# Patient Record
Sex: Female | Born: 1965 | Race: Black or African American | Hispanic: No | Marital: Married | Smoking: Current every day smoker
Health system: Southern US, Community
[De-identification: ages and names within clinical notes are randomized; demographics above are authoritative.]

## PROBLEM LIST (undated history)

## (undated) HISTORY — PX: TUBAL LIGATION: SHX77

---

## 1998-04-04 ENCOUNTER — Other Ambulatory Visit: Admission: RE | Admit: 1998-04-04 | Discharge: 1998-04-04 | Payer: Self-pay | Admitting: Obstetrics

## 1998-04-29 ENCOUNTER — Other Ambulatory Visit: Admission: RE | Admit: 1998-04-29 | Discharge: 1998-04-29 | Payer: Self-pay | Admitting: Obstetrics

## 1998-05-17 ENCOUNTER — Ambulatory Visit (HOSPITAL_COMMUNITY): Admission: RE | Admit: 1998-05-17 | Discharge: 1998-05-17 | Payer: Self-pay | Admitting: Obstetrics

## 2011-04-07 ENCOUNTER — Emergency Department (HOSPITAL_COMMUNITY)
Admission: EM | Admit: 2011-04-07 | Discharge: 2011-04-07 | Disposition: A | Discharge status: Home / Self Care | Payer: Managed Care, Other (non HMO) | Attending: Emergency Medicine | Admitting: Emergency Medicine

## 2011-04-07 DIAGNOSIS — M545 Low back pain, unspecified: Secondary | ICD-10-CM | POA: Insufficient documentation

## 2011-04-07 DIAGNOSIS — M549 Dorsalgia, unspecified: Secondary | ICD-10-CM | POA: Insufficient documentation

## 2011-04-09 ENCOUNTER — Emergency Department (HOSPITAL_COMMUNITY): Payer: Managed Care, Other (non HMO)

## 2011-04-09 ENCOUNTER — Emergency Department (HOSPITAL_COMMUNITY)
Admission: EM | Admit: 2011-04-09 | Discharge: 2011-04-09 | Disposition: A | Discharge status: Home / Self Care | Payer: Managed Care, Other (non HMO) | Attending: Emergency Medicine | Admitting: Emergency Medicine

## 2011-04-09 DIAGNOSIS — M5430 Sciatica, unspecified side: Secondary | ICD-10-CM | POA: Insufficient documentation

## 2011-04-09 DIAGNOSIS — M545 Low back pain, unspecified: Secondary | ICD-10-CM | POA: Insufficient documentation

## 2011-04-17 ENCOUNTER — Emergency Department (HOSPITAL_COMMUNITY): Admission: EM | Admit: 2011-04-17 | Payer: Managed Care, Other (non HMO) | Source: Home / Self Care

## 2013-01-11 ENCOUNTER — Encounter (HOSPITAL_COMMUNITY): Payer: Self-pay | Admitting: Emergency Medicine

## 2013-01-11 ENCOUNTER — Emergency Department (HOSPITAL_COMMUNITY): Payer: Managed Care, Other (non HMO)

## 2013-01-11 ENCOUNTER — Emergency Department (HOSPITAL_COMMUNITY)
Admission: EM | Admit: 2013-01-11 | Discharge: 2013-01-11 | Disposition: A | Discharge status: Home / Self Care | Payer: Managed Care, Other (non HMO) | Attending: Emergency Medicine | Admitting: Emergency Medicine

## 2013-01-11 DIAGNOSIS — S335XXA Sprain of ligaments of lumbar spine, initial encounter: Secondary | ICD-10-CM | POA: Insufficient documentation

## 2013-01-11 DIAGNOSIS — Y9389 Activity, other specified: Secondary | ICD-10-CM | POA: Insufficient documentation

## 2013-01-11 DIAGNOSIS — S139XXA Sprain of joints and ligaments of unspecified parts of neck, initial encounter: Secondary | ICD-10-CM | POA: Insufficient documentation

## 2013-01-11 DIAGNOSIS — S161XXA Strain of muscle, fascia and tendon at neck level, initial encounter: Secondary | ICD-10-CM

## 2013-01-11 DIAGNOSIS — F17200 Nicotine dependence, unspecified, uncomplicated: Secondary | ICD-10-CM | POA: Insufficient documentation

## 2013-01-11 DIAGNOSIS — Y92410 Unspecified street and highway as the place of occurrence of the external cause: Secondary | ICD-10-CM | POA: Insufficient documentation

## 2013-01-11 MED ORDER — NAPROXEN 500 MG PO TABS: 500.0000 mg | ORAL_TABLET | Freq: Two times a day (BID) | ORAL | Status: DC

## 2013-01-11 MED ORDER — METHOCARBAMOL 500 MG PO TABS: 500.0000 mg | ORAL_TABLET | Freq: Two times a day (BID) | ORAL | Status: DC

## 2013-01-11 NOTE — ED Provider Notes (Signed)
Medical screening examination/treatment/procedure(s) were performed by non-physician practitioner and as supervising physician I was immediately available for consultation/collaboration.  [name], MD 01/11/13 2304 
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
## 2015-12-27 ENCOUNTER — Other Ambulatory Visit (HOSPITAL_COMMUNITY)
Admission: RE | Admit: 2015-12-27 | Discharge: 2015-12-27 | Disposition: A | Discharge status: Home / Self Care | Payer: BLUE CROSS/BLUE SHIELD | Source: Ambulatory Visit | Attending: Family Medicine | Admitting: Family Medicine

## 2015-12-27 ENCOUNTER — Other Ambulatory Visit: Payer: Self-pay | Admitting: Family Medicine

## 2015-12-27 DIAGNOSIS — Z124 Encounter for screening for malignant neoplasm of cervix: Secondary | ICD-10-CM | POA: Insufficient documentation

## 2015-12-29 LAB — CYTOLOGY - PAP

## 2019-04-21 ENCOUNTER — Other Ambulatory Visit: Payer: Self-pay

## 2019-04-21 ENCOUNTER — Encounter (HOSPITAL_COMMUNITY): Payer: Self-pay | Admitting: Family Medicine

## 2019-04-21 ENCOUNTER — Emergency Department (HOSPITAL_COMMUNITY)
Admission: EM | Admit: 2019-04-21 | Discharge: 2019-04-21 | Disposition: A | Discharge status: Home / Self Care | Payer: Managed Care, Other (non HMO) | Attending: Emergency Medicine | Admitting: Emergency Medicine

## 2019-04-21 ENCOUNTER — Emergency Department (HOSPITAL_COMMUNITY): Payer: Managed Care, Other (non HMO)

## 2019-04-21 DIAGNOSIS — R030 Elevated blood-pressure reading, without diagnosis of hypertension: Secondary | ICD-10-CM | POA: Insufficient documentation

## 2019-04-21 DIAGNOSIS — R51 Headache: Secondary | ICD-10-CM | POA: Diagnosis present

## 2019-04-21 DIAGNOSIS — F17210 Nicotine dependence, cigarettes, uncomplicated: Secondary | ICD-10-CM | POA: Insufficient documentation

## 2019-04-21 LAB — BASIC METABOLIC PANEL
Anion gap: 7 (ref 5–15)
BUN: 8 mg/dL (ref 6–20)
CO2: 27 mmol/L (ref 22–32)
Calcium: 9 mg/dL (ref 8.9–10.3)
Chloride: 107 mmol/L (ref 98–111)
Creatinine, Ser: 0.56 mg/dL (ref 0.44–1.00)
GFR calc Af Amer: >60 mL/min (ref >60)
GFR calc non Af Amer: >60 mL/min (ref >60)
Glucose, Bld: 84 mg/dL (ref 70–99)
Potassium: 4 mmol/L (ref 3.5–5.1)
Sodium: 141 mmol/L (ref 135–145)

## 2019-04-21 LAB — I-STAT BETA HCG BLOOD, ED (MC, WL, AP ONLY): I-stat hCG, quantitative: <5 m[IU]/mL (ref <5)

## 2019-04-21 LAB — CBC WITH DIFFERENTIAL/PLATELET
Abs Immature Granulocytes: 0 10*3/uL (ref 0.00–0.07)
Basophils Absolute: 0.1 10*3/uL (ref 0.0–0.1)
Basophils Relative: 2 %
Eosinophils Absolute: 0.2 10*3/uL (ref 0.0–0.5)
Eosinophils Relative: 4 %
HCT: 46 % (ref 36.0–46.0)
Hemoglobin: 14.6 g/dL (ref 12.0–15.0)
Immature Granulocytes: 0 %
Lymphocytes Relative: 40 %
Lymphs Abs: 1.8 10*3/uL (ref 0.7–4.0)
MCH: 31.1 pg (ref 26.0–34.0)
MCHC: 31.7 g/dL (ref 30.0–36.0)
MCV: 97.9 fL (ref 80.0–100.0)
Monocytes Absolute: 0.3 10*3/uL (ref 0.1–1.0)
Monocytes Relative: 6 %
Neutro Abs: 2.2 10*3/uL (ref 1.7–7.7)
Neutrophils Relative %: 48 %
Platelets: 244 10*3/uL (ref 150–400)
RBC: 4.7 MIL/uL (ref 3.87–5.11)
RDW: 13.6 % (ref 11.5–15.5)
WBC: 4.5 10*3/uL (ref 4.0–10.5)
nRBC: 0 % (ref 0.0–0.2)

## 2019-04-21 LAB — URINALYSIS, ROUTINE W REFLEX MICROSCOPIC
Bacteria, UA: NONE SEEN
Bilirubin Urine: NEGATIVE
Glucose, UA: NEGATIVE mg/dL
Ketones, ur: NEGATIVE mg/dL
Nitrite: NEGATIVE
Protein, ur: NEGATIVE mg/dL
Specific Gravity, Urine: 1.001 — ABNORMAL LOW (ref 1.005–1.030)
pH: 7 (ref 5.0–8.0)

## 2019-04-21 LAB — I-STAT CHEM 8, ED
BUN: 7 mg/dL (ref 6–20)
Calcium, Ion: 1.14 mmol/L — ABNORMAL LOW (ref 1.15–1.40)
Chloride: 105 mmol/L (ref 98–111)
Creatinine, Ser: 0.6 mg/dL (ref 0.44–1.00)
Glucose, Bld: 81 mg/dL (ref 70–99)
HCT: 45 % (ref 36.0–46.0)
Hemoglobin: 15.3 g/dL — ABNORMAL HIGH (ref 12.0–15.0)
Potassium: 4.1 mmol/L (ref 3.5–5.1)
Sodium: 139 mmol/L (ref 135–145)
TCO2: 27 mmol/L (ref 22–32)

## 2019-04-21 LAB — TROPONIN I: Troponin I: <0.03 ng/mL (ref <0.03)

## 2019-04-21 MED ORDER — ACETAMINOPHEN 325 MG PO TABS
650.0000 mg | ORAL_TABLET | Freq: Once | ORAL | Status: AC
  Administered 2019-04-21: 11:00:00 650 mg via ORAL
  Filled 2019-04-21: qty 2

## 2019-04-21 MED ORDER — LISINOPRIL 10 MG PO TABS: 10.0000 mg | ORAL_TABLET | Freq: Every day | ORAL | 1 refills | Status: DC

## 2019-04-21 MED ORDER — HYDRALAZINE HCL 20 MG/ML IJ SOLN
20.0000 mg | Freq: Once | INTRAMUSCULAR | Status: AC
  Administered 2019-04-21: 20 mg via INTRAVENOUS
  Filled 2019-04-21: qty 1

## 2019-04-21 NOTE — ED Notes (Signed)
Pt d/c home per MD order, Discharge summary reviewed, pt verbalizes understanding. Ambulatory, no s/s of distress noted.

## 2019-04-21 NOTE — Discharge Instructions (Addendum)
Regarding your blood pressure, please follow-up with a primary care provider. There were some abnormalities noted on the EKG today.  These may be related to your high blood pressure.  Recommend follow-up with the cardiologist on this matter. Return to the emergency department for dizziness, vision changes, change in feeling or function in the extremities, chest pain, shortness of breath, or any other major concerns.

## 2019-04-21 NOTE — ED Notes (Signed)
Bed: WA19 Expected date:  Expected time:  Means of arrival:  Comments: 

## 2019-04-21 NOTE — ED Triage Notes (Signed)
Patient is complaining of right occipital headache for the last three days. Denies dizziness and lightheadedness. Patient had her BP checked at work, went to an urgent care, and referred to ED for further evaluation.

## 2019-04-21 NOTE — ED Provider Notes (Addendum)
[facility]-EMERGENCY DEPT Provider Note   CSN: [id number] Arrival date & time: 04/21/19  0900    History   Chief Complaint Chief Complaint  Patient presents with  . Hypertension  . Headache    HPI Elizabeth Walsh is a 53 y.o. female.     HPI   Elizabeth Walsh is a 53 y.o. female, patient with known past medical history, presenting to the ED with concern for hypertension.  States she began to have a intermittent posterior, mostly right-sided headache, 3/10, feels like a pressure, recurring over the last 3 days. Today she measured her blood pressure at work and noted it to be high.  She states her blood pressure was 160 systolic about a year ago, however, she has not been formally diagnosed with hypertension.  She denies fever/chills, dizziness, chest pain, shortness of breath, abdominal pain, changes in urination, vision abnormalities, confusion, epistaxis, neurologic deficits, or any other complaints.    History reviewed. No pertinent past medical history.  There are no active problems to display for this patient.   Past Surgical History:  Procedure Laterality Date  . TUBAL LIGATION       OB History   No obstetric history on file.      Home Medications    Prior to Admission medications   Medication Sig Start Date End Date Taking? Authorizing Provider  diphenhydrAMINE (BENADRYL) 25 mg capsule Take 25 mg by mouth every 6 (six) hours as needed for allergies.   Yes [provider]  naproxen sodium (ALEVE) 220 MG tablet Take 440 mg by mouth 2 (two) times daily as needed (pain/headache).   Yes [provider]  lisinopril (ZESTRIL) 10 MG tablet Take 1 tablet (10 mg total) by mouth daily. 04/21/19 06/20/19  Delara Shepheard C, PA-C  methocarbamol (ROBAXIN) 500 MG tablet Take 1 tablet (500 mg total) by mouth 2 (two) times daily. Patient not taking: Reported on 04/21/2019 01/11/13   Roxy HorsemanBrowning, Robert, PA-C  naproxen (NAPROSYN) 500 MG tablet Take 1  tablet (500 mg total) by mouth 2 (two) times daily with a meal. Patient not taking: Reported on 04/21/2019 01/11/13   Roxy HorsemanBrowning, Robert, PA-C    Family History History reviewed. No pertinent family history.  Social History Social History   Tobacco Use  . Smoking status: Current Every Day Smoker    Packs/day: 0.50    Types: Cigarettes  . Smokeless tobacco: Never Used  Substance Use Topics  . Alcohol use: Yes    Comment: 1 glass of wine a day   . Drug use: No     Allergies   Patient has no known allergies.   Review of Systems Review of Systems  Constitutional: Negative for chills, diaphoresis and fever.  Eyes: Negative for visual disturbance.  Respiratory: Negative for cough and shortness of breath.   Cardiovascular: Negative for chest pain, palpitations and leg swelling.  Gastrointestinal: Negative for abdominal pain, nausea and vomiting.  Genitourinary: Negative for decreased urine volume and difficulty urinating.  Musculoskeletal: Negative for neck pain and neck stiffness.  Neurological: Positive for headaches. Negative for dizziness, syncope, facial asymmetry, speech difficulty, weakness, light-headedness and numbness.  Psychiatric/Behavioral: Negative for confusion.  All other systems reviewed and are negative.    Physical Exam Updated Vital Signs BP (!) 214/114 (BP Location: Left Arm)   Pulse (!) 58   Temp 97.8 F (36.6 C) (Oral)   Resp 18   Ht 5\' 4"  (1.626 m)   Wt 90.7 kg  SpO2 100%   BMI 34.33 kg/m   Physical Exam Vitals signs and nursing note reviewed.  Constitutional:      General: She is not in acute distress.    Appearance: She is well-developed. She is not diaphoretic.  HENT:     Head: Normocephalic and atraumatic.     Mouth/Throat:     Mouth: Mucous membranes are moist.     Pharynx: Oropharynx is clear.  Eyes:     Conjunctiva/sclera: Conjunctivae normal.  Neck:     Musculoskeletal: Neck supple.  Cardiovascular:     Rate and Rhythm: Normal  rate and regular rhythm.     Pulses: Normal pulses.          Radial pulses are 2+ on the right side and 2+ on the left side.       Posterior tibial pulses are 2+ on the right side and 2+ on the left side.     Heart sounds: Normal heart sounds.     Comments: Tactile temperature in the extremities appropriate and equal bilaterally. Pulmonary:     Effort: Pulmonary effort is normal. No respiratory distress.     Breath sounds: Normal breath sounds.  Abdominal:     Palpations: Abdomen is soft.     Tenderness: There is no abdominal tenderness. There is no guarding.  Musculoskeletal:     Right lower leg: No edema.     Left lower leg: No edema.  Lymphadenopathy:     Cervical: No cervical adenopathy.  Skin:    General: Skin is warm and dry.  Neurological:     Mental Status: She is alert.     Comments: Sensation grossly intact to light touch in the extremities.  Grip strengths equal bilaterally.  Strength 5/5 in all extremities. No gait disturbance. Coordination intact. Cranial nerves III-XII grossly intact. No facial droop.   Psychiatric:        Mood and Affect: Mood and affect normal.        Speech: Speech normal.        Behavior: Behavior normal.      ED Treatments / Results  Labs (all labs ordered are listed, but only abnormal results are displayed) Labs Reviewed  URINALYSIS, ROUTINE W REFLEX MICROSCOPIC - Abnormal; Notable for the following components:      Result Value   Color, Urine COLORLESS (*)    Specific Gravity, Urine 1.001 (*)    Hgb urine dipstick SMALL (*)    Leukocytes,Ua TRACE (*)    All other components within normal limits  I-STAT CHEM 8, ED - Abnormal; Notable for the following components:   Calcium, Ion 1.14 (*)    Hemoglobin 15.3 (*)    All other components within normal limits  BASIC METABOLIC PANEL  CBC WITH DIFFERENTIAL/PLATELET  TROPONIN I  I-STAT BETA HCG BLOOD, ED ([facility], [facility], [facility] ONLY)    EKG EKG Interpretation  Date/Time:  Tuesday April 21 2019  11:38:59 EDT Ventricular Rate:  52 PR Interval:    QRS Duration: 78 QT Interval:  460 QTC Calculation: 428 R Axis:   48 Text Interpretation:  Sinus rhythm Abnormal R-wave progression, early transition Consider left ventricular hypertrophy ST elevation, consider inferior injury Baseline wander in lead(s) II aVR nonspecfic ,diffuse ST elevation Confirmed by Arby Barrette [phone]) on 04/21/2019 12:49:44 PM   Radiology Dg Chest 2 View  Result Date: 04/21/2019 CLINICAL DATA:  Hypertension EXAM: CHEST - 2 VIEW COMPARISON:  12/31/2006 FINDINGS: The heart size and mediastinal contours are within normal  limits. Both lungs are clear. The visualized skeletal structures are unremarkable. IMPRESSION: No active cardiopulmonary disease. Electronically Signed   By: Alcide Clever M.D.   On: 04/21/2019 12:15    Procedures Procedures (including critical care time)  Medications Ordered in ED Medications  acetaminophen (TYLENOL) tablet 650 mg (650 mg Oral Given 04/21/19 1114)  hydrALAZINE (APRESOLINE) injection 20 mg (20 mg Intravenous Given 04/21/19 1310)     Initial Impression / Assessment and Plan / ED Course  I have reviewed the triage vital signs and the nursing notes.  Pertinent labs & imaging results that were available during my care of the patient were reviewed by me and considered in my medical decision making (see chart for details).  Clinical Course as of Apr 20 1508  Tue Apr 21, 2019  1357 Patient states her headache has resolved.   [SJ]    Clinical Course User Index [SJ] Crysta Gulick C, PA-C       Patient presents with concern for high blood pressure.  She also has a minor headache.  No evidence of focal neuro deficit.  No kidney dysfunction, proteinuria, or elevated troponin.  Doubt hypertensive emergency.  She does have some mild ST abnormalities on EKG without prior with which to compare.  She will follow-up with a PCP on her hypertension and cardiology on the EKG.  We will start the  patient on lisinopril.  The patient was given instructions for home care as well as strict return precautions. Patient voices understanding of these instructions, accepts the plan, and is comfortable with discharge.  Findings and plan of care discussed with Arby Barrette, MD.    Vitals:   04/21/19 1345 04/21/19 1400 04/21/19 1430 04/21/19 1500  BP: (!) 197/130 (!) 173/89 (!) 159/91 (!) 167/92  Pulse: 90 80 80 72  Resp: Temp:      TempSrc:      SpO2: 100% 100% 100% 100%  Weight:      Height:         Final Clinical Impressions(s) / ED Diagnoses   Final diagnoses:  Blood pressure elevated without history of HTN    ED Discharge Orders         Ordered    lisinopril (ZESTRIL) 10 MG tablet  Daily     04/21/19 1422           Anselm Pancoast, PA-C 04/21/19 1511    Anselm Pancoast, PA-C 04/21/19 1511    Arby Barrette, MD 04/28/19 1452

## 2021-12-13 ENCOUNTER — Encounter (HOSPITAL_COMMUNITY): Admission: EM | Disposition: A | Discharge status: Rehabilitation Facility | Payer: Self-pay | Source: Home / Self Care

## 2021-12-13 ENCOUNTER — Inpatient Hospital Stay (HOSPITAL_COMMUNITY): Payer: 59

## 2021-12-13 ENCOUNTER — Emergency Department (HOSPITAL_COMMUNITY): Payer: 59

## 2021-12-13 ENCOUNTER — Inpatient Hospital Stay (HOSPITAL_COMMUNITY): Payer: 59 | Admitting: Certified Registered Nurse Anesthetist

## 2021-12-13 ENCOUNTER — Encounter (HOSPITAL_COMMUNITY): Payer: Self-pay

## 2021-12-13 ENCOUNTER — Other Ambulatory Visit: Payer: Self-pay

## 2021-12-13 ENCOUNTER — Inpatient Hospital Stay (HOSPITAL_COMMUNITY)
Admission: EM | Admit: 2021-12-13 | Discharge: 2021-12-20 | DRG: 956 | Disposition: A | Discharge status: Rehabilitation Facility | Payer: 59 | Attending: Surgery | Admitting: Surgery

## 2021-12-13 DIAGNOSIS — S72351B Displaced comminuted fracture of shaft of right femur, initial encounter for open fracture type I or II: Secondary | ICD-10-CM | POA: Diagnosis present

## 2021-12-13 DIAGNOSIS — Z419 Encounter for procedure for purposes other than remedying health state, unspecified: Secondary | ICD-10-CM

## 2021-12-13 DIAGNOSIS — R52 Pain, unspecified: Secondary | ICD-10-CM

## 2021-12-13 DIAGNOSIS — M898X9 Other specified disorders of bone, unspecified site: Secondary | ICD-10-CM | POA: Diagnosis present

## 2021-12-13 DIAGNOSIS — E559 Vitamin D deficiency, unspecified: Secondary | ICD-10-CM | POA: Diagnosis present

## 2021-12-13 DIAGNOSIS — I10 Essential (primary) hypertension: Secondary | ICD-10-CM | POA: Diagnosis present

## 2021-12-13 DIAGNOSIS — F17210 Nicotine dependence, cigarettes, uncomplicated: Secondary | ICD-10-CM | POA: Diagnosis present

## 2021-12-13 DIAGNOSIS — S0003XA Contusion of scalp, initial encounter: Secondary | ICD-10-CM | POA: Diagnosis present

## 2021-12-13 DIAGNOSIS — G5632 Lesion of radial nerve, left upper limb: Secondary | ICD-10-CM | POA: Diagnosis present

## 2021-12-13 DIAGNOSIS — Z20822 Contact with and (suspected) exposure to covid-19: Secondary | ICD-10-CM | POA: Diagnosis present

## 2021-12-13 DIAGNOSIS — D62 Acute posthemorrhagic anemia: Secondary | ICD-10-CM | POA: Diagnosis not present

## 2021-12-13 DIAGNOSIS — E876 Hypokalemia: Secondary | ICD-10-CM | POA: Diagnosis not present

## 2021-12-13 DIAGNOSIS — S2243XA Multiple fractures of ribs, bilateral, initial encounter for closed fracture: Secondary | ICD-10-CM | POA: Diagnosis present

## 2021-12-13 DIAGNOSIS — S8253XA Displaced fracture of medial malleolus of unspecified tibia, initial encounter for closed fracture: Secondary | ICD-10-CM

## 2021-12-13 DIAGNOSIS — Y92410 Unspecified street and highway as the place of occurrence of the external cause: Secondary | ICD-10-CM

## 2021-12-13 DIAGNOSIS — T1490XA Injury, unspecified, initial encounter: Secondary | ICD-10-CM

## 2021-12-13 DIAGNOSIS — S27322A Contusion of lung, bilateral, initial encounter: Secondary | ICD-10-CM | POA: Diagnosis present

## 2021-12-13 DIAGNOSIS — I959 Hypotension, unspecified: Secondary | ICD-10-CM | POA: Diagnosis present

## 2021-12-13 DIAGNOSIS — Z23 Encounter for immunization: Secondary | ICD-10-CM | POA: Diagnosis not present

## 2021-12-13 DIAGNOSIS — R609 Edema, unspecified: Secondary | ICD-10-CM

## 2021-12-13 DIAGNOSIS — S42352A Displaced comminuted fracture of shaft of humerus, left arm, initial encounter for closed fracture: Secondary | ICD-10-CM | POA: Diagnosis present

## 2021-12-13 DIAGNOSIS — S2249XA Multiple fractures of ribs, unspecified side, initial encounter for closed fracture: Secondary | ICD-10-CM

## 2021-12-13 DIAGNOSIS — T148XXA Other injury of unspecified body region, initial encounter: Secondary | ICD-10-CM

## 2021-12-13 DIAGNOSIS — S7291XA Unspecified fracture of right femur, initial encounter for closed fracture: Secondary | ICD-10-CM

## 2021-12-13 DIAGNOSIS — S9304XA Dislocation of right ankle joint, initial encounter: Secondary | ICD-10-CM | POA: Diagnosis present

## 2021-12-13 DIAGNOSIS — Z9889 Other specified postprocedural states: Secondary | ICD-10-CM

## 2021-12-13 HISTORY — PX: ORIF FEMUR FRACTURE: SHX2119

## 2021-12-13 HISTORY — PX: CLOSED REDUCTION HUMERUS FRACTURE: SHX985

## 2021-12-13 LAB — COMPREHENSIVE METABOLIC PANEL
ALT: 55 U/L — ABNORMAL HIGH (ref 0–44)
AST: 94 U/L — ABNORMAL HIGH (ref 15–41)
Albumin: 3.5 g/dL (ref 3.5–5.0)
Alkaline Phosphatase: 54 U/L (ref 38–126)
Anion gap: 11 (ref 5–15)
BUN: 10 mg/dL (ref 6–20)
CO2: 24 mmol/L (ref 22–32)
Calcium: 8.6 mg/dL — ABNORMAL LOW (ref 8.9–10.3)
Chloride: 99 mmol/L (ref 98–111)
Creatinine, Ser: 0.89 mg/dL (ref 0.44–1.00)
GFR, Estimated: >60 mL/min (ref >60)
Glucose, Bld: 208 mg/dL — ABNORMAL HIGH (ref 70–99)
Potassium: 2.8 mmol/L — ABNORMAL LOW (ref 3.5–5.1)
Sodium: 134 mmol/L — ABNORMAL LOW (ref 135–145)
Total Bilirubin: 0.9 mg/dL (ref 0.3–1.2)
Total Protein: 6 g/dL — ABNORMAL LOW (ref 6.5–8.1)

## 2021-12-13 LAB — CBC
HCT: 37 % (ref 36.0–46.0)
Hemoglobin: 12.6 g/dL (ref 12.0–15.0)
MCH: 32.9 pg (ref 26.0–34.0)
MCHC: 34.1 g/dL (ref 30.0–36.0)
MCV: 96.6 fL (ref 80.0–100.0)
Platelets: 307 10*3/uL (ref 150–400)
RBC: 3.83 MIL/uL — ABNORMAL LOW (ref 3.87–5.11)
RDW: 13 % (ref 11.5–15.5)
WBC: 16.5 10*3/uL — ABNORMAL HIGH (ref 4.0–10.5)
nRBC: 0 % (ref 0.0–0.2)

## 2021-12-13 LAB — SAMPLE TO BLOOD BANK

## 2021-12-13 LAB — I-STAT CHEM 8, ED
BUN: 10 mg/dL (ref 6–20)
Calcium, Ion: 1.11 mmol/L — ABNORMAL LOW (ref 1.15–1.40)
Chloride: 99 mmol/L (ref 98–111)
Creatinine, Ser: 0.8 mg/dL (ref 0.44–1.00)
Glucose, Bld: 203 mg/dL — ABNORMAL HIGH (ref 70–99)
HCT: 39 % (ref 36.0–46.0)
Hemoglobin: 13.3 g/dL (ref 12.0–15.0)
Potassium: 3 mmol/L — ABNORMAL LOW (ref 3.5–5.1)
Sodium: 137 mmol/L (ref 135–145)
TCO2: 27 mmol/L (ref 22–32)

## 2021-12-13 LAB — I-STAT BETA HCG BLOOD, ED (MC, WL, AP ONLY): I-stat hCG, quantitative: <5 m[IU]/mL (ref <5)

## 2021-12-13 LAB — SURGICAL PCR SCREEN
MRSA, PCR: NEGATIVE
Staphylococcus aureus: NEGATIVE

## 2021-12-13 LAB — PROTIME-INR
INR: 1 (ref 0.8–1.2)
Prothrombin Time: 13.3 seconds (ref 11.4–15.2)

## 2021-12-13 LAB — ETHANOL: Alcohol, Ethyl (B): <10 mg/dL (ref <10)

## 2021-12-13 LAB — RESP PANEL BY RT-PCR (FLU A&B, COVID) ARPGX2
Influenza A by PCR: NEGATIVE
Influenza B by PCR: NEGATIVE
SARS Coronavirus 2 by RT PCR: NEGATIVE

## 2021-12-13 LAB — LACTIC ACID, PLASMA: Lactic Acid, Venous: 2.7 mmol/L (ref 0.5–1.9)

## 2021-12-13 SURGERY — OPEN REDUCTION INTERNAL FIXATION (ORIF) DISTAL FEMUR FRACTURE
Anesthesia: General | Laterality: Right

## 2021-12-13 MED ORDER — DEXAMETHASONE SODIUM PHOSPHATE 10 MG/ML IJ SOLN
INTRAMUSCULAR | Status: DC | PRN
  Administered 2021-12-13: 4 mg via INTRAVENOUS

## 2021-12-13 MED ORDER — PROPOFOL 10 MG/ML IV BOLUS
INTRAVENOUS | Status: DC | PRN
Start: 2021-12-13 — End: 2021-12-13
  Administered 2021-12-13: 100 mg via INTRAVENOUS

## 2021-12-13 MED ORDER — HYDROCODONE-ACETAMINOPHEN 5-325 MG PO TABS: 2.0000 | ORAL_TABLET | ORAL | Status: DC | PRN

## 2021-12-13 MED ORDER — STERILE WATER FOR IRRIGATION IR SOLN
Status: DC | PRN
Start: 2021-12-13 — End: 2021-12-13
  Administered 2021-12-13: 2000 mL

## 2021-12-13 MED ORDER — ALBUMIN HUMAN 5 % IV SOLN: INTRAVENOUS | Status: DC | PRN

## 2021-12-13 MED ORDER — CEFAZOLIN SODIUM-DEXTROSE 2-3 GM-%(50ML) IV SOLR
INTRAVENOUS | Status: DC | PRN
Start: 2021-12-13 — End: 2021-12-13
  Administered 2021-12-13: 2 g via INTRAVENOUS

## 2021-12-13 MED ORDER — PROPOFOL 10 MG/ML IV BOLUS
INTRAVENOUS | Status: AC
  Filled 2021-12-13: qty 20

## 2021-12-13 MED ORDER — ACETAMINOPHEN 10 MG/ML IV SOLN
1000.0000 mg | Freq: Once | INTRAVENOUS | Status: DC | PRN
  Administered 2021-12-14: 1000 mg via INTRAVENOUS

## 2021-12-13 MED ORDER — ROCURONIUM BROMIDE 10 MG/ML (PF) SYRINGE
PREFILLED_SYRINGE | INTRAVENOUS | Status: DC | PRN
  Administered 2021-12-13: 10 mg via INTRAVENOUS
  Administered 2021-12-13: 70 mg via INTRAVENOUS

## 2021-12-13 MED ORDER — CHLORHEXIDINE GLUCONATE 4 % EX LIQD
60.0000 mL | Freq: Once | CUTANEOUS | Status: DC
  Filled 2021-12-13: qty 60

## 2021-12-13 MED ORDER — SUCCINYLCHOLINE CHLORIDE 200 MG/10ML IV SOSY
PREFILLED_SYRINGE | INTRAVENOUS | Status: DC | PRN
  Administered 2021-12-13: 80 mg via INTRAVENOUS

## 2021-12-13 MED ORDER — POVIDONE-IODINE 10 % EX SWAB: 2.0000 "application " | Freq: Once | CUTANEOUS | Status: DC

## 2021-12-13 MED ORDER — PROMETHAZINE HCL 25 MG/ML IJ SOLN: 6.2500 mg | INTRAMUSCULAR | Status: DC | PRN

## 2021-12-13 MED ORDER — OXYCODONE HCL 5 MG/5ML PO SOLN: 5.0000 mg | Freq: Once | ORAL | Status: DC | PRN

## 2021-12-13 MED ORDER — SUCCINYLCHOLINE CHLORIDE 200 MG/10ML IV SOSY
PREFILLED_SYRINGE | INTRAVENOUS | Status: AC
  Filled 2021-12-13: qty 10

## 2021-12-13 MED ORDER — VANCOMYCIN HCL 1000 MG IV SOLR
INTRAVENOUS | Status: AC
  Filled 2021-12-13: qty 20

## 2021-12-13 MED ORDER — LACTATED RINGERS IV SOLN: INTRAVENOUS | Status: DC | PRN

## 2021-12-13 MED ORDER — CEFAZOLIN SODIUM-DEXTROSE 2-4 GM/100ML-% IV SOLN
INTRAVENOUS | Status: AC
  Filled 2021-12-13: qty 100

## 2021-12-13 MED ORDER — ENOXAPARIN SODIUM 30 MG/0.3ML IJ SOSY
30.0000 mg | PREFILLED_SYRINGE | Freq: Two times a day (BID) | INTRAMUSCULAR | Status: DC
  Administered 2021-12-14 (×2): 30 mg via SUBCUTANEOUS
  Filled 2021-12-13 (×2): qty 0.3

## 2021-12-13 MED ORDER — LIDOCAINE 2% (20 MG/ML) 5 ML SYRINGE
INTRAMUSCULAR | Status: AC
  Filled 2021-12-13: qty 5

## 2021-12-13 MED ORDER — FENTANYL CITRATE PF 50 MCG/ML IJ SOSY
100.0000 ug | PREFILLED_SYRINGE | Freq: Once | INTRAMUSCULAR | Status: AC
  Administered 2021-12-13: 17:00:00 50 ug via INTRAVENOUS

## 2021-12-13 MED ORDER — EPHEDRINE SULFATE (PRESSORS) 50 MG/ML IJ SOLN
INTRAMUSCULAR | Status: DC | PRN
  Administered 2021-12-13: 10 mg via INTRAVENOUS

## 2021-12-13 MED ORDER — PANTOPRAZOLE SODIUM 40 MG IV SOLR
40.0000 mg | Freq: Every day | INTRAVENOUS | Status: DC
  Administered 2021-12-14: 40 mg via INTRAVENOUS
  Filled 2021-12-13: qty 40

## 2021-12-13 MED ORDER — MIDAZOLAM HCL 2 MG/2ML IJ SOLN
INTRAMUSCULAR | Status: AC
  Filled 2021-12-13: qty 2

## 2021-12-13 MED ORDER — CEFAZOLIN SODIUM-DEXTROSE 2-4 GM/100ML-% IV SOLN
2.0000 g | Freq: Once | INTRAVENOUS | Status: AC
  Administered 2021-12-13: 16:00:00 2 g via INTRAVENOUS

## 2021-12-13 MED ORDER — FENTANYL CITRATE (PF) 250 MCG/5ML IJ SOLN
INTRAMUSCULAR | Status: DC | PRN
  Administered 2021-12-13: 50 ug via INTRAVENOUS
  Administered 2021-12-13: 100 ug via INTRAVENOUS
  Administered 2021-12-13 (×2): 50 ug via INTRAVENOUS

## 2021-12-13 MED ORDER — LIDOCAINE 2% (20 MG/ML) 5 ML SYRINGE
INTRAMUSCULAR | Status: DC | PRN
Start: 2021-12-13 — End: 2021-12-13
  Administered 2021-12-13: 60 mg via INTRAVENOUS

## 2021-12-13 MED ORDER — CEFAZOLIN SODIUM 1 G IJ SOLR
INTRAMUSCULAR | Status: AC
  Filled 2021-12-13: qty 20

## 2021-12-13 MED ORDER — AMISULPRIDE (ANTIEMETIC) 5 MG/2ML IV SOLN: 10.0000 mg | Freq: Once | INTRAVENOUS | Status: DC | PRN

## 2021-12-13 MED ORDER — SODIUM CHLORIDE 0.9 % IR SOLN
Status: DC | PRN
  Administered 2021-12-13: 3000 mL

## 2021-12-13 MED ORDER — VASOPRESSIN 20 UNIT/ML IV SOLN
INTRAVENOUS | Status: AC
  Filled 2021-12-13: qty 1

## 2021-12-13 MED ORDER — PHENYLEPHRINE HCL-NACL 20-0.9 MG/250ML-% IV SOLN
INTRAVENOUS | Status: DC | PRN
  Administered 2021-12-13: 100 ug/min via INTRAVENOUS

## 2021-12-13 MED ORDER — CHLORHEXIDINE GLUCONATE 0.12 % MT SOLN
OROMUCOSAL | Status: AC
  Filled 2021-12-13: qty 15

## 2021-12-13 MED ORDER — TETANUS-DIPHTHERIA TOXOIDS TD 5-2 LFU IM INJ
0.5000 mL | INJECTION | Freq: Once | INTRAMUSCULAR | Status: DC
Start: 2021-12-13 — End: 2021-12-13

## 2021-12-13 MED ORDER — EPHEDRINE 5 MG/ML INJ
INTRAVENOUS | Status: AC
  Filled 2021-12-13: qty 5

## 2021-12-13 MED ORDER — HYDROMORPHONE HCL 1 MG/ML IJ SOLN
2.0000 mg | INTRAMUSCULAR | Status: DC | PRN
  Administered 2021-12-14 – 2021-12-15 (×6): 2 mg via INTRAVENOUS
  Filled 2021-12-13 (×6): qty 2

## 2021-12-13 MED ORDER — SODIUM CHLORIDE 0.9 % IV BOLUS
1000.0000 mL | Freq: Once | INTRAVENOUS | Status: AC
  Administered 2021-12-13: 16:00:00 1000 mL via INTRAVENOUS

## 2021-12-13 MED ORDER — ONDANSETRON HCL 4 MG/2ML IJ SOLN
INTRAMUSCULAR | Status: AC
  Filled 2021-12-13: qty 2

## 2021-12-13 MED ORDER — VANCOMYCIN HCL 1000 MG IV SOLR
INTRAVENOUS | Status: DC | PRN
  Administered 2021-12-13: 1000 mg via TOPICAL

## 2021-12-13 MED ORDER — ONDANSETRON HCL 4 MG/2ML IJ SOLN
INTRAMUSCULAR | Status: DC | PRN
  Administered 2021-12-13: 4 mg via INTRAVENOUS

## 2021-12-13 MED ORDER — CEFAZOLIN SODIUM-DEXTROSE 2-4 GM/100ML-% IV SOLN: 2.0000 g | INTRAVENOUS | Status: DC

## 2021-12-13 MED ORDER — 0.9 % SODIUM CHLORIDE (POUR BTL) OPTIME
TOPICAL | Status: DC | PRN
Start: 2021-12-13 — End: 2021-12-13
  Administered 2021-12-13: 22:00:00 1000 mL

## 2021-12-13 MED ORDER — DEXTROSE-NACL 5-0.9 % IV SOLN: INTRAVENOUS | Status: DC

## 2021-12-13 MED ORDER — FENTANYL CITRATE (PF) 100 MCG/2ML IJ SOLN
INTRAMUSCULAR | Status: AC
  Filled 2021-12-13: qty 2

## 2021-12-13 MED ORDER — SODIUM CHLORIDE 0.9 % IV SOLN: INTRAVENOUS | Status: DC

## 2021-12-13 MED ORDER — SUGAMMADEX SODIUM 200 MG/2ML IV SOLN
INTRAVENOUS | Status: DC | PRN
  Administered 2021-12-13: 200 mg via INTRAVENOUS

## 2021-12-13 MED ORDER — FENTANYL CITRATE (PF) 100 MCG/2ML IJ SOLN
25.0000 ug | INTRAMUSCULAR | Status: DC | PRN
  Administered 2021-12-13 – 2021-12-14 (×3): 50 ug via INTRAVENOUS

## 2021-12-13 MED ORDER — PHENYLEPHRINE 40 MCG/ML (10ML) SYRINGE FOR IV PUSH (FOR BLOOD PRESSURE SUPPORT)
PREFILLED_SYRINGE | INTRAVENOUS | Status: AC
  Filled 2021-12-13: qty 10

## 2021-12-13 MED ORDER — ROCURONIUM BROMIDE 10 MG/ML (PF) SYRINGE
PREFILLED_SYRINGE | INTRAVENOUS | Status: AC
  Filled 2021-12-13: qty 10

## 2021-12-13 MED ORDER — PHENYLEPHRINE 40 MCG/ML (10ML) SYRINGE FOR IV PUSH (FOR BLOOD PRESSURE SUPPORT)
PREFILLED_SYRINGE | INTRAVENOUS | Status: DC | PRN
  Administered 2021-12-13 (×2): 200 ug via INTRAVENOUS
  Administered 2021-12-13: 120 ug via INTRAVENOUS
  Administered 2021-12-13: 160 ug via INTRAVENOUS
  Administered 2021-12-13: 120 ug via INTRAVENOUS

## 2021-12-13 MED ORDER — PANTOPRAZOLE SODIUM 40 MG PO TBEC
40.0000 mg | DELAYED_RELEASE_TABLET | Freq: Every day | ORAL | Status: DC
  Administered 2021-12-16 – 2021-12-20 (×4): 40 mg via ORAL
  Filled 2021-12-13 (×5): qty 1

## 2021-12-13 MED ORDER — TETANUS-DIPHTH-ACELL PERTUSSIS 5-2.5-18.5 LF-MCG/0.5 IM SUSY
0.5000 mL | PREFILLED_SYRINGE | Freq: Once | INTRAMUSCULAR | Status: AC
  Administered 2021-12-13: 17:00:00 0.5 mL via INTRAMUSCULAR

## 2021-12-13 MED ORDER — HYDROMORPHONE HCL 1 MG/ML IJ SOLN
1.0000 mg | Freq: Once | INTRAMUSCULAR | Status: AC
  Administered 2021-12-13: 18:00:00 1 mg via INTRAVENOUS
  Filled 2021-12-13: qty 1

## 2021-12-13 MED ORDER — FENTANYL CITRATE (PF) 100 MCG/2ML IJ SOLN
INTRAMUSCULAR | Status: AC
  Administered 2021-12-13: 16:00:00 50 ug
  Filled 2021-12-13: qty 2

## 2021-12-13 MED ORDER — MIDAZOLAM HCL 2 MG/2ML IJ SOLN
INTRAMUSCULAR | Status: DC | PRN
  Administered 2021-12-13 (×2): 1 mg via INTRAVENOUS

## 2021-12-13 MED ORDER — OXYCODONE HCL 5 MG PO TABS: 5.0000 mg | ORAL_TABLET | Freq: Once | ORAL | Status: DC | PRN

## 2021-12-13 MED ORDER — IOHEXOL 350 MG/ML SOLN
100.0000 mL | Freq: Once | INTRAVENOUS | Status: AC | PRN
  Administered 2021-12-13: 17:00:00 100 mL via INTRAVENOUS

## 2021-12-13 MED ORDER — FENTANYL CITRATE (PF) 250 MCG/5ML IJ SOLN
INTRAMUSCULAR | Status: AC
  Filled 2021-12-13: qty 5

## 2021-12-13 MED ORDER — PHENYLEPHRINE 40 MCG/ML (10ML) SYRINGE FOR IV PUSH (FOR BLOOD PRESSURE SUPPORT)
PREFILLED_SYRINGE | INTRAVENOUS | Status: AC
  Filled 2021-12-13: qty 20

## 2021-12-13 SURGICAL SUPPLY — 69 items
BAG COUNTER SPONGE SURGICOUNT (BAG) ×3 IMPLANT
BAG SPNG CNTER NS LX DISP (BAG) ×2
BIT DRILL AO GAMMA 4.2X180 (BIT) ×1 IMPLANT
BNDG COHESIVE 4X5 TAN ST LF (GAUZE/BANDAGES/DRESSINGS) ×1 IMPLANT
BNDG ELASTIC 4X5.8 VLCR STR LF (GAUZE/BANDAGES/DRESSINGS) ×5 IMPLANT
BNDG ELASTIC 6X5.8 VLCR STR LF (GAUZE/BANDAGES/DRESSINGS) ×2 IMPLANT
BNDG GAUZE ELAST 4 BULKY (GAUZE/BANDAGES/DRESSINGS) ×3 IMPLANT
COVER MAYO STAND STRL (DRAPES) ×1 IMPLANT
COVER SURGICAL LIGHT HANDLE (MISCELLANEOUS) ×1 IMPLANT
DRAPE C-ARM 42X72 X-RAY (DRAPES) ×3 IMPLANT
DRAPE C-ARMOR (DRAPES) ×3 IMPLANT
DRAPE IMP U-DRAPE 54X76 (DRAPES) ×2 IMPLANT
DRAPE ORTHO SPLIT 77X108 STRL (DRAPES) ×6
DRAPE SURG IRRIG POUCH 19X23 (DRAPES) ×1 IMPLANT
DRAPE SURG ORHT 6 SPLT 77X108 (DRAPES) ×4 IMPLANT
DRAPE U-SHAPE 47X51 STRL (DRAPES) ×4 IMPLANT
DRSG ADAPTIC 3X8 NADH LF (GAUZE/BANDAGES/DRESSINGS) ×3 IMPLANT
DRSG PAD ABDOMINAL 8X10 ST (GAUZE/BANDAGES/DRESSINGS) ×8 IMPLANT
DRSG TEGADERM 4X4.75 (GAUZE/BANDAGES/DRESSINGS) ×5 IMPLANT
ELECT REM PT RETURN 9FT ADLT (ELECTROSURGICAL) ×3
ELECTRODE REM PT RTRN 9FT ADLT (ELECTROSURGICAL) ×2 IMPLANT
GAUZE SPONGE 4X4 12PLY STRL (GAUZE/BANDAGES/DRESSINGS) ×2 IMPLANT
GAUZE SPONGE 4X4 12PLY STRL LF (GAUZE/BANDAGES/DRESSINGS) ×1 IMPLANT
GLOVE SRG 8 PF TXTR STRL LF DI (GLOVE) ×2 IMPLANT
GLOVE SURG ENC MOIS LTX SZ7.5 (GLOVE) ×3 IMPLANT
GLOVE SURG SYN 7.5  E (GLOVE) ×3
GLOVE SURG SYN 7.5 E (GLOVE) ×2 IMPLANT
GLOVE SURG SYN 7.5 PF PI (GLOVE) ×2 IMPLANT
GLOVE SURG UNDER POLY LF SZ7.5 (GLOVE) ×3 IMPLANT
GLOVE SURG UNDER POLY LF SZ8 (GLOVE) ×6
GOWN STRL REUS W/ TWL LRG LVL3 (GOWN DISPOSABLE) ×2 IMPLANT
GOWN STRL REUS W/ TWL XL LVL3 (GOWN DISPOSABLE) ×4 IMPLANT
GOWN STRL REUS W/TWL LRG LVL3 (GOWN DISPOSABLE) ×3
GOWN STRL REUS W/TWL XL LVL3 (GOWN DISPOSABLE) ×6
GUIDEROD T2 3X1000 (ROD) ×1 IMPLANT
K-WIRE RECON 3.2X400 (WIRE) ×6
KIT BASIN OR (CUSTOM PROCEDURE TRAY) ×3 IMPLANT
KIT TURNOVER KIT B (KITS) ×3 IMPLANT
KWIRE RECON 3.2X400 (WIRE) IMPLANT
MANIFOLD NEPTUNE II (INSTRUMENTS) ×3 IMPLANT
NAIL RECON RIGHT 9X360MM (Nail) ×1 IMPLANT
NS IRRIG 1000ML POUR BTL (IV SOLUTION) ×3 IMPLANT
PACK TOTAL JOINT (CUSTOM PROCEDURE TRAY) ×3 IMPLANT
PAD ARMBOARD 7.5X6 YLW CONV (MISCELLANEOUS) ×8 IMPLANT
PAD CAST 4YDX4 CTTN HI CHSV (CAST SUPPLIES) IMPLANT
PADDING CAST COTTON 4X4 STRL (CAST SUPPLIES) ×6
PADDING CAST COTTON 6X4 STRL (CAST SUPPLIES) ×2 IMPLANT
REAMER SHAFT BIXCUT (INSTRUMENTS) ×1 IMPLANT
SCREW LAG RECON 6.5X80MM (Screw) ×2 IMPLANT
SCREW LOCKING T2 F/T  5MMX45MM (Screw) ×6 IMPLANT
SCREW LOCKING T2 F/T 5MMX45MM (Screw) IMPLANT
SET CYSTO W/LG BORE CLAMP LF (SET/KITS/TRAYS/PACK) ×1 IMPLANT
SPLINT PLASTER CAST XFAST 5X30 (CAST SUPPLIES) IMPLANT
SPLINT PLASTER XFAST SET 5X30 (CAST SUPPLIES) ×1
SPONGE T-LAP 18X18 ~~LOC~~+RFID (SPONGE) ×2 IMPLANT
STAPLER VISISTAT 35W (STAPLE) IMPLANT
STEPDRILL FOR LAG SCREW RECON (DRILL) ×1 IMPLANT
SUT ETHILON 3 0 PS 1 (SUTURE) ×1 IMPLANT
SUT MNCRL AB 4-0 PS2 18 (SUTURE) ×2 IMPLANT
SUT MON AB 2-0 CT1 27 (SUTURE) ×2 IMPLANT
SUT VIC AB 0 CT1 27 (SUTURE) ×6
SUT VIC AB 0 CT1 27XBRD ANBCTR (SUTURE) ×4 IMPLANT
SUT VIC AB 2-0 CT1 27 (SUTURE) ×6
SUT VIC AB 2-0 CT1 TAPERPNT 27 (SUTURE) IMPLANT
TOWEL GREEN STERILE (TOWEL DISPOSABLE) ×6 IMPLANT
TOWEL GREEN STERILE FF (TOWEL DISPOSABLE) ×3 IMPLANT
TOWEL OR NON WOVEN STRL DISP B (DISPOSABLE) ×3 IMPLANT
TRAY FOLEY MTR SLVR 16FR STAT (SET/KITS/TRAYS/PACK) ×1 IMPLANT
WATER STERILE IRR 1000ML POUR (IV SOLUTION) ×6 IMPLANT
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
## 2021-12-13 NOTE — ED Notes (Signed)
Patient transported to CT w/ Jaquita Folds, Jimena Wieczorek RN, and ortho tech
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
## 2021-12-13 NOTE — Anesthesia Procedure Notes (Signed)
Procedure Name: Intubation Date/Time: 12/13/2021 7:59 PM Performed by: Inda Coke, CRNA Pre-anesthesia Checklist: Patient identified, Emergency Drugs available, Suction available and Patient being monitored Patient Re-evaluated:Patient Re-evaluated prior to induction Oxygen Delivery Method: Circle System Utilized Preoxygenation: Pre-oxygenation with 100% oxygen Induction Type: IV induction, Rapid sequence and Cricoid Pressure applied Ventilation: Mask ventilation without difficulty Laryngoscope Size: Mac and 3 Grade View: Grade I Tube type: Oral Tube size: 7.0 mm Number of attempts: 1 Airway Equipment and Method: Stylet and Oral airway Placement Confirmation: ETT inserted through vocal cords under direct vision, positive ETCO2 and breath sounds checked- equal and bilateral Secured at: 21 cm Tube secured with: Tape Dental Injury: Teeth and Oropharynx as per pre-operative assessment
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
## 2021-12-13 NOTE — ED Notes (Signed)
Ortho tech at bedside, order for more pain meds for splinting.
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
## 2021-12-13 NOTE — ED Notes (Signed)
Pt returned from CT °

## 2021-12-13 NOTE — Progress Notes (Signed)
Patient seen examined myself in the ED.  Full consult note to follow.  In brief she has a right open femur fracture with approximately 3 cm open wound.  Wound is clean.  She received Ancef from the emergency department.  She also has pain in the right lower leg, right ankle, right foot, and left humerus.  Neurovascularly intact distally.  Plan for OR later this evening pending scans and evaluation by the trauma team.

## 2021-12-13 NOTE — Transfer of Care (Signed)
Immediate Anesthesia Transfer of Care Note  Patient: Elizabeth Walsh  Procedure(s) Performed: OPEN REDUCTION INTERNAL FIXATION (ORIF) DISTAL FEMUR FRACTURE AND IRRIGATION AND DEBRIDEMENT (Right) CLOSED REDUCTION LEFT ANKLE DISLOCATION (Left)  Patient Location: PACU  Anesthesia Type:General  Level of Consciousness: drowsy  Airway & Oxygen Therapy: Patient Spontanous Breathing  Post-op Assessment: Report given to RN and Post -op Vital signs reviewed and stable  Post vital signs: Reviewed and stable  Last Vitals:  Vitals Value Taken Time  BP 94/63 12/13/21 2334  Temp    Pulse 86 12/13/21 2333  Resp 12 12/13/21 2333  SpO2 98 % 12/13/21 2333  Vitals shown include unvalidated device data.  Last Pain:  Vitals:   12/13/21 1631  TempSrc:   PainSc: 10-Worst pain ever         Complications: No notable events documented.
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
## 2021-12-13 NOTE — ED Notes (Signed)
Pt's belongings - a cell phone, a wallet and a black watch - were given to daughter, [name].

## 2021-12-14 ENCOUNTER — Inpatient Hospital Stay (HOSPITAL_COMMUNITY): Payer: 59

## 2021-12-14 ENCOUNTER — Encounter (HOSPITAL_COMMUNITY): Payer: Self-pay

## 2021-12-14 LAB — CBC
HCT: 26.1 % — ABNORMAL LOW (ref 36.0–46.0)
HCT: 21.7 % — ABNORMAL LOW (ref 36.0–46.0)
Hemoglobin: 8.6 g/dL — ABNORMAL LOW (ref 12.0–15.0)
Hemoglobin: 7.2 g/dL — ABNORMAL LOW (ref 12.0–15.0)
MCH: 32.5 pg (ref 26.0–34.0)
MCH: 31.9 pg (ref 26.0–34.0)
MCHC: 33 g/dL (ref 30.0–36.0)
MCHC: 33.2 g/dL (ref 30.0–36.0)
MCV: 98.5 fL (ref 80.0–100.0)
MCV: 96 fL (ref 80.0–100.0)
Platelets: 193 10*3/uL (ref 150–400)
Platelets: 169 10*3/uL (ref 150–400)
RBC: 2.65 MIL/uL — ABNORMAL LOW (ref 3.87–5.11)
RBC: 2.26 MIL/uL — ABNORMAL LOW (ref 3.87–5.11)
RDW: 13.3 % (ref 11.5–15.5)
RDW: 13.1 % (ref 11.5–15.5)
WBC: 6 10*3/uL (ref 4.0–10.5)
WBC: 7.3 10*3/uL (ref 4.0–10.5)
nRBC: 0 % (ref 0.0–0.2)
nRBC: 0 % (ref 0.0–0.2)

## 2021-12-14 LAB — BASIC METABOLIC PANEL
Anion gap: 11 (ref 5–15)
BUN: 9 mg/dL (ref 6–20)
CO2: 22 mmol/L (ref 22–32)
Calcium: 7.7 mg/dL — ABNORMAL LOW (ref 8.9–10.3)
Chloride: 104 mmol/L (ref 98–111)
Creatinine, Ser: 0.71 mg/dL (ref 0.44–1.00)
GFR, Estimated: >60 mL/min (ref >60)
Glucose, Bld: 179 mg/dL — ABNORMAL HIGH (ref 70–99)
Potassium: 3.3 mmol/L — ABNORMAL LOW (ref 3.5–5.1)
Sodium: 137 mmol/L (ref 135–145)

## 2021-12-14 LAB — HEMOGLOBIN AND HEMATOCRIT, BLOOD
HCT: 21.9 % — ABNORMAL LOW (ref 36.0–46.0)
Hemoglobin: 7.5 g/dL — ABNORMAL LOW (ref 12.0–15.0)

## 2021-12-14 LAB — ABO/RH: ABO/RH(D): B POS

## 2021-12-14 LAB — PREPARE RBC (CROSSMATCH)

## 2021-12-14 LAB — HIV ANTIBODY (ROUTINE TESTING W REFLEX): HIV Screen 4th Generation wRfx: NONREACTIVE

## 2021-12-14 MED ORDER — METHOCARBAMOL 500 MG PO TABS
500.0000 mg | ORAL_TABLET | Freq: Four times a day (QID) | ORAL | Status: DC | PRN
  Administered 2021-12-14: 500 mg via ORAL
  Filled 2021-12-14: qty 1

## 2021-12-14 MED ORDER — OXYCODONE HCL 5 MG PO TABS
5.0000 mg | ORAL_TABLET | ORAL | Status: DC | PRN
  Administered 2021-12-14: 10 mg via ORAL
  Administered 2021-12-14 – 2021-12-15 (×2): 5 mg via ORAL
  Filled 2021-12-14: qty 2
  Filled 2021-12-14 (×2): qty 1

## 2021-12-14 MED ORDER — SODIUM CHLORIDE 0.9% IV SOLUTION: Freq: Once | INTRAVENOUS | Status: AC

## 2021-12-14 MED ORDER — FENTANYL CITRATE (PF) 100 MCG/2ML IJ SOLN
INTRAMUSCULAR | Status: AC
  Filled 2021-12-14: qty 2

## 2021-12-14 MED ORDER — ACETAMINOPHEN 500 MG PO TABS
1000.0000 mg | ORAL_TABLET | Freq: Four times a day (QID) | ORAL | Status: DC
  Administered 2021-12-14 – 2021-12-20 (×18): 1000 mg via ORAL
  Filled 2021-12-14 (×20): qty 2

## 2021-12-14 MED ORDER — METHOCARBAMOL 1000 MG/10ML IJ SOLN
500.0000 mg | Freq: Four times a day (QID) | INTRAVENOUS | Status: DC
  Administered 2021-12-14 – 2021-12-15 (×3): 500 mg via INTRAVENOUS
  Filled 2021-12-14: qty 5
  Filled 2021-12-14: qty 500
  Filled 2021-12-14 (×4): qty 5

## 2021-12-14 MED ORDER — CEFAZOLIN SODIUM-DEXTROSE 2-4 GM/100ML-% IV SOLN
2.0000 g | Freq: Three times a day (TID) | INTRAVENOUS | Status: DC
  Administered 2021-12-14 – 2021-12-15 (×4): 2 g via INTRAVENOUS
  Filled 2021-12-14 (×6): qty 100

## 2021-12-14 MED ORDER — CHLORHEXIDINE GLUCONATE CLOTH 2 % EX PADS
6.0000 | MEDICATED_PAD | Freq: Every day | CUTANEOUS | Status: DC
  Administered 2021-12-14 – 2021-12-20 (×6): 6 via TOPICAL

## 2021-12-14 MED ORDER — METHOCARBAMOL 1000 MG/10ML IJ SOLN
500.0000 mg | Freq: Four times a day (QID) | INTRAVENOUS | Status: DC | PRN
  Filled 2021-12-14 (×2): qty 5

## 2021-12-14 MED ORDER — POTASSIUM CHLORIDE 20 MEQ PO PACK
40.0000 meq | PACK | Freq: Once | ORAL | Status: AC
  Administered 2021-12-14: 40 meq via ORAL
  Filled 2021-12-14: qty 2

## 2021-12-14 MED ORDER — ACETAMINOPHEN 10 MG/ML IV SOLN
INTRAVENOUS | Status: AC
  Filled 2021-12-14: qty 100

## 2021-12-14 MED ORDER — ONDANSETRON HCL 4 MG/2ML IJ SOLN: 4.0000 mg | Freq: Four times a day (QID) | INTRAMUSCULAR | Status: DC | PRN

## 2021-12-14 NOTE — Progress Notes (Signed)
ACE bandage applied by Dr. Janee Morn to left wrist. When this RN entered room, patient and family request the bandage be removed due to it being "too tight." Ace wrap removed at patient request, PRN Dilaudid given. Family remains at bedside.

## 2021-12-14 NOTE — Progress Notes (Signed)
OT Cancellation Note  Patient Details Name: GWENDOLIN BRIEL MRN: [id number] DOB: [birth year]   Cancelled Treatment:    Reason Eval/Treat Not Completed: Pain limiting ability to participate;Fatigue/lethargy limiting ability to participate (Will continue to follow.)  Evern Bio 12/14/2021, 3:31 PM Martie Round, OTR/L Acute Rehabilitation Services Pager: [phone] Office: [phone]

## 2021-12-14 NOTE — Progress Notes (Signed)
MD on call paged informed that pat is scheduled for possible surgery 1/27 and has Lovenox scheduled was advised to give as scheduled. Ilean Skill LPN

## 2021-12-14 NOTE — Plan of Care (Signed)

## 2021-12-14 NOTE — Anesthesia Postprocedure Evaluation (Signed)
Anesthesia Post Note  Patient: Elizabeth Walsh  Procedure(s) Performed: OPEN REDUCTION INTERNAL FIXATION (ORIF) DISTAL FEMUR FRACTURE AND IRRIGATION AND DEBRIDEMENT (Right) CLOSED REDUCTION LEFT ANKLE DISLOCATION (Left)     Patient location during evaluation: PACU Anesthesia Type: General Level of consciousness: awake Pain management: pain level controlled Vital Signs Assessment: post-procedure vital signs reviewed and stable Respiratory status: spontaneous breathing, nonlabored ventilation, respiratory function stable and patient connected to nasal cannula oxygen Cardiovascular status: blood pressure returned to baseline and stable Postop Assessment: no apparent nausea or vomiting Anesthetic complications: no   No notable events documented.  Last Vitals:  Vitals:   12/14/21 0101 12/14/21 0456  BP: 115/83 110/78  Pulse: 83 70  Resp: 18 18  Temp:  36.7 C  SpO2: 99% 98%    Last Pain:  Vitals:   12/14/21 0500  TempSrc:   PainSc: 4                  Aarian Cleaver P Rigley Niess

## 2021-12-14 NOTE — Progress Notes (Signed)
°  Transition of Care [facility]) Screening Note   Patient Details  Name: Elizabeth Walsh Date of Birth: [birth year]   Transition of Care [facility]) CM/SW Contact:    Glennon Mac, RN Phone Number: 12/14/2021, 5:25 PM    Transition of Care Department [facility]) has reviewed patient and no TOC needs have been identified at this time. We will continue to monitor patient advancement through interdisciplinary progression rounds. If new patient transition needs arise, please place a TOC consult.   Quintella Baton, RN, BSN  Trauma/Neuro ICU Case Manager [phone]

## 2021-12-14 NOTE — Progress Notes (Signed)
Orthopaedic Trauma Service   OTS aware of pt Keep pt NPO after MN  Possible OR tomorrow (12/15/2021) vs Saturday to address L humerus fracture and R talonavicular dislocation with navicular fracture.  OR schedule does not look promising for tomorrow at this time but we will keep pt NPO in the event one of our other cases is unable to proceed   Mearl Latin, PA-C [phone] (C) 12/14/2021, 8:33 PM  [facility] [address] [place] [address] [phone] Collier Bullock (F)       Patient ID: Elizabeth Walsh, female   DOB: 1966/07/05, 56 y.o.   MRN: [id number]
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
## 2021-12-14 NOTE — Progress Notes (Signed)
MD on call informed that patient c/o left arm burning that radiates to the right. MD called back and is aware of pain no new orders at this time will continue to monitor. Arthor Captain LPN

## 2021-12-14 NOTE — Progress Notes (Signed)
Incentive spirometer given to patient once patient was fully awake and able to follow directions. Patient was able to achieve 1250 mL with encouragement from family. Family and patient instructed to do at least 10 times an hour while patient awake, daughter verbalized understanding.
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
## 2021-12-14 NOTE — Progress Notes (Addendum)
Blood administration started, patient forgetful of giving consent but family present and reminded patient. Patient remains agreeable to receiving blood. Son at bedside, VSS. This nurse encouraged patient to use incentive spirometer, but patient refused at this time. Family at bedside said they would try at a later time.
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
## 2021-12-14 NOTE — Progress Notes (Addendum)
Trauma Event Note     Interventions:  Spoke with son -- Antonio at [phone] requesting updates from this morning's tests---  Trauma PA notified.  Incentive spirometer given to primary RN to give to pt when she is more awake.    Last imported Vital Signs BP 108/68 (BP Location: Right Arm)    Pulse 87    Temp 98.1 F (36.7 C) (Oral)    Resp 16    Ht 5\' 4"  (1.626 m)    Wt 190 lb (86.2 kg)    SpO2 96%    BMI 32.61 kg/m   Trending CBC Recent Labs    12/13/21 1614 12/13/21 1631 12/14/21 0111  WBC 16.5*  --  6.0  HGB 12.6 13.3 8.6*  HCT 37.0 39.0 26.1*  PLT 307  --  193    Trending Coag's Recent Labs    12/13/21 1614  INR 1.0    Trending BMET Recent Labs    12/13/21 1614 12/13/21 1631 12/14/21 0111  NA 134* 137 137  K 2.8* 3.0* 3.3*  CL 99 99 104  CO2 24  --  22  BUN 10 10 9   CREATININE 0.89 0.80 0.71  GLUCOSE 208* 203* 179*      [place]  Trauma Response RN  Please call TRN at [phone] for further assistance.

## 2021-12-15 ENCOUNTER — Inpatient Hospital Stay (HOSPITAL_COMMUNITY): Payer: 59 | Admitting: Anesthesiology

## 2021-12-15 ENCOUNTER — Encounter (HOSPITAL_COMMUNITY): Admission: EM | Disposition: A | Discharge status: Rehabilitation Facility | Payer: Self-pay | Source: Home / Self Care

## 2021-12-15 ENCOUNTER — Inpatient Hospital Stay (HOSPITAL_COMMUNITY): Payer: 59

## 2021-12-15 ENCOUNTER — Encounter (HOSPITAL_COMMUNITY): Payer: Self-pay

## 2021-12-15 ENCOUNTER — Other Ambulatory Visit: Payer: Self-pay

## 2021-12-15 DIAGNOSIS — S72351B Displaced comminuted fracture of shaft of right femur, initial encounter for open fracture type I or II: Secondary | ICD-10-CM | POA: Diagnosis present

## 2021-12-15 DIAGNOSIS — S9304XA Dislocation of right ankle joint, initial encounter: Secondary | ICD-10-CM | POA: Diagnosis present

## 2021-12-15 DIAGNOSIS — S42352A Displaced comminuted fracture of shaft of humerus, left arm, initial encounter for closed fracture: Secondary | ICD-10-CM | POA: Diagnosis present

## 2021-12-15 HISTORY — PX: ORIF HUMERUS FRACTURE: SHX2126

## 2021-12-15 HISTORY — PX: OPEN REDUCTION INTERNAL FIXATION (ORIF) FOOT LISFRANC FRACTURE: SHX5990

## 2021-12-15 LAB — BASIC METABOLIC PANEL
Anion gap: 5 (ref 5–15)
BUN: 6 mg/dL (ref 6–20)
CO2: 27 mmol/L (ref 22–32)
Calcium: 7.3 mg/dL — ABNORMAL LOW (ref 8.9–10.3)
Chloride: 105 mmol/L (ref 98–111)
Creatinine, Ser: 0.71 mg/dL (ref 0.44–1.00)
GFR, Estimated: >60 mL/min (ref >60)
Glucose, Bld: 132 mg/dL — ABNORMAL HIGH (ref 70–99)
Potassium: 3.2 mmol/L — ABNORMAL LOW (ref 3.5–5.1)
Sodium: 137 mmol/L (ref 135–145)

## 2021-12-15 LAB — CBC
HCT: 21.5 % — ABNORMAL LOW (ref 36.0–46.0)
Hemoglobin: 7.2 g/dL — ABNORMAL LOW (ref 12.0–15.0)
MCH: 31.2 pg (ref 26.0–34.0)
MCHC: 33.5 g/dL (ref 30.0–36.0)
MCV: 93.1 fL (ref 80.0–100.0)
Platelets: 142 10*3/uL — ABNORMAL LOW (ref 150–400)
RBC: 2.31 MIL/uL — ABNORMAL LOW (ref 3.87–5.11)
RDW: 14.5 % (ref 11.5–15.5)
WBC: 6.3 10*3/uL (ref 4.0–10.5)
nRBC: 0 % (ref 0.0–0.2)

## 2021-12-15 LAB — VITAMIN D 25 HYDROXY (VIT D DEFICIENCY, FRACTURES): Vit D, 25-Hydroxy: 19.82 ng/mL — ABNORMAL LOW (ref 30–100)

## 2021-12-15 LAB — PREPARE RBC (CROSSMATCH)

## 2021-12-15 SURGERY — OPEN REDUCTION INTERNAL FIXATION (ORIF) HUMERAL SHAFT FRACTURE
Anesthesia: General | Site: Foot | Laterality: Right

## 2021-12-15 MED ORDER — LACTATED RINGERS IV SOLN: INTRAVENOUS | Status: DC

## 2021-12-15 MED ORDER — 0.9 % SODIUM CHLORIDE (POUR BTL) OPTIME
TOPICAL | Status: DC | PRN
Start: 2021-12-15 — End: 2021-12-15
  Administered 2021-12-15: 1000 mL

## 2021-12-15 MED ORDER — HYDROMORPHONE HCL 1 MG/ML IJ SOLN
INTRAMUSCULAR | Status: AC
  Filled 2021-12-15: qty 2

## 2021-12-15 MED ORDER — FENTANYL CITRATE (PF) 250 MCG/5ML IJ SOLN
INTRAMUSCULAR | Status: AC
  Filled 2021-12-15: qty 5

## 2021-12-15 MED ORDER — OXYCODONE HCL 5 MG PO TABS
ORAL_TABLET | ORAL | Status: AC
  Filled 2021-12-15: qty 2

## 2021-12-15 MED ORDER — METHOCARBAMOL 1000 MG/10ML IJ SOLN
500.0000 mg | Freq: Four times a day (QID) | INTRAVENOUS | Status: DC | PRN
  Filled 2021-12-15: qty 5

## 2021-12-15 MED ORDER — LACTATED RINGERS IV SOLN
INTRAVENOUS | Status: DC | PRN
Start: 2021-12-15 — End: 2021-12-15

## 2021-12-15 MED ORDER — PHENYLEPHRINE 40 MCG/ML (10ML) SYRINGE FOR IV PUSH (FOR BLOOD PRESSURE SUPPORT)
PREFILLED_SYRINGE | INTRAVENOUS | Status: AC
  Filled 2021-12-15: qty 10

## 2021-12-15 MED ORDER — SODIUM CHLORIDE 0.9 % IV SOLN: INTRAVENOUS | Status: DC | PRN

## 2021-12-15 MED ORDER — DOCUSATE SODIUM 100 MG PO CAPS
100.0000 mg | ORAL_CAPSULE | Freq: Two times a day (BID) | ORAL | Status: DC
  Administered 2021-12-15 – 2021-12-20 (×10): 100 mg via ORAL
  Filled 2021-12-15 (×12): qty 1

## 2021-12-15 MED ORDER — ENOXAPARIN SODIUM 30 MG/0.3ML IJ SOSY
30.0000 mg | PREFILLED_SYRINGE | Freq: Two times a day (BID) | INTRAMUSCULAR | Status: DC
  Administered 2021-12-16 – 2021-12-20 (×9): 30 mg via SUBCUTANEOUS
  Filled 2021-12-15 (×10): qty 0.3

## 2021-12-15 MED ORDER — MIDAZOLAM HCL 2 MG/2ML IJ SOLN
INTRAMUSCULAR | Status: DC | PRN
  Administered 2021-12-15: 2 mg via INTRAVENOUS

## 2021-12-15 MED ORDER — SUGAMMADEX SODIUM 200 MG/2ML IV SOLN
INTRAVENOUS | Status: DC | PRN
  Administered 2021-12-15: 200 mg via INTRAVENOUS

## 2021-12-15 MED ORDER — CEFAZOLIN SODIUM-DEXTROSE 2-3 GM-%(50ML) IV SOLR
INTRAVENOUS | Status: DC | PRN
  Administered 2021-12-15: 2 g via INTRAVENOUS

## 2021-12-15 MED ORDER — ONDANSETRON HCL 4 MG PO TABS: 4.0000 mg | ORAL_TABLET | Freq: Four times a day (QID) | ORAL | Status: DC | PRN

## 2021-12-15 MED ORDER — CALCIUM CARBONATE ANTACID 500 MG PO CHEW
400.0000 mg | CHEWABLE_TABLET | Freq: Every day | ORAL | Status: DC
  Administered 2021-12-15: 400 mg via ORAL
  Filled 2021-12-15: qty 2

## 2021-12-15 MED ORDER — FENTANYL CITRATE (PF) 250 MCG/5ML IJ SOLN
INTRAMUSCULAR | Status: DC | PRN
Start: 2021-12-15 — End: 2021-12-15
  Administered 2021-12-15: 100 ug via INTRAVENOUS

## 2021-12-15 MED ORDER — PHENYLEPHRINE HCL (PRESSORS) 10 MG/ML IV SOLN
INTRAVENOUS | Status: DC | PRN
  Administered 2021-12-15: 80 ug via INTRAVENOUS

## 2021-12-15 MED ORDER — CEFAZOLIN SODIUM 1 G IJ SOLR
INTRAMUSCULAR | Status: AC
  Filled 2021-12-15: qty 20

## 2021-12-15 MED ORDER — ACETAMINOPHEN 325 MG PO TABS: 325.0000 mg | ORAL_TABLET | Freq: Four times a day (QID) | ORAL | Status: DC | PRN

## 2021-12-15 MED ORDER — ENSURE ENLIVE PO LIQD
237.0000 mL | Freq: Two times a day (BID) | ORAL | Status: DC
  Administered 2021-12-16 – 2021-12-20 (×7): 237 mL via ORAL
  Filled 2021-12-15 (×2): qty 237

## 2021-12-15 MED ORDER — POTASSIUM CHLORIDE 20 MEQ PO PACK: 40.0000 meq | PACK | Freq: Two times a day (BID) | ORAL | Status: AC

## 2021-12-15 MED ORDER — PHENYLEPHRINE HCL-NACL 20-0.9 MG/250ML-% IV SOLN
INTRAVENOUS | Status: DC | PRN
  Administered 2021-12-15: 50 ug/min via INTRAVENOUS

## 2021-12-15 MED ORDER — ONDANSETRON HCL 4 MG/2ML IJ SOLN
INTRAMUSCULAR | Status: AC
  Filled 2021-12-15: qty 2

## 2021-12-15 MED ORDER — PROPOFOL 10 MG/ML IV BOLUS
INTRAVENOUS | Status: AC
  Filled 2021-12-15: qty 20

## 2021-12-15 MED ORDER — DEXAMETHASONE SODIUM PHOSPHATE 10 MG/ML IJ SOLN
INTRAMUSCULAR | Status: AC
  Filled 2021-12-15: qty 1

## 2021-12-15 MED ORDER — POLYETHYLENE GLYCOL 3350 17 G PO PACK: 17.0000 g | PACK | Freq: Every day | ORAL | Status: DC | PRN

## 2021-12-15 MED ORDER — ONDANSETRON HCL 4 MG/2ML IJ SOLN: 4.0000 mg | Freq: Once | INTRAMUSCULAR | Status: DC | PRN

## 2021-12-15 MED ORDER — LIDOCAINE HCL (CARDIAC) PF 100 MG/5ML IV SOSY
PREFILLED_SYRINGE | INTRAVENOUS | Status: DC | PRN
  Administered 2021-12-15: 60 mg via INTRAVENOUS

## 2021-12-15 MED ORDER — PROPOFOL 10 MG/ML IV BOLUS
INTRAVENOUS | Status: DC | PRN
Start: 2021-12-15 — End: 2021-12-15
  Administered 2021-12-15: 130 mg via INTRAVENOUS

## 2021-12-15 MED ORDER — HYDROMORPHONE HCL 1 MG/ML IJ SOLN
INTRAMUSCULAR | Status: AC
  Filled 2021-12-15: qty 0.5

## 2021-12-15 MED ORDER — ROCURONIUM 10MG/ML (10ML) SYRINGE FOR MEDFUSION PUMP - OPTIME
INTRAVENOUS | Status: DC | PRN
Start: 2021-12-15 — End: 2021-12-15
  Administered 2021-12-15: 70 mg via INTRAVENOUS

## 2021-12-15 MED ORDER — MIDAZOLAM HCL 2 MG/2ML IJ SOLN
INTRAMUSCULAR | Status: AC
  Filled 2021-12-15: qty 2

## 2021-12-15 MED ORDER — LIDOCAINE 2% (20 MG/ML) 5 ML SYRINGE
INTRAMUSCULAR | Status: AC
  Filled 2021-12-15: qty 5

## 2021-12-15 MED ORDER — CHLORHEXIDINE GLUCONATE 0.12 % MT SOLN: 15.0000 mL | Freq: Once | OROMUCOSAL | Status: AC

## 2021-12-15 MED ORDER — SODIUM CHLORIDE 0.9% IV SOLUTION: Freq: Once | INTRAVENOUS | Status: DC

## 2021-12-15 MED ORDER — HYDROMORPHONE HCL 1 MG/ML IJ SOLN
0.2500 mg | INTRAMUSCULAR | Status: DC | PRN
  Administered 2021-12-15 (×2): 1 mg via INTRAVENOUS

## 2021-12-15 MED ORDER — DIPHENHYDRAMINE HCL 12.5 MG/5ML PO ELIX: 12.5000 mg | ORAL_SOLUTION | ORAL | Status: DC | PRN

## 2021-12-15 MED ORDER — ONDANSETRON HCL 4 MG/2ML IJ SOLN
INTRAMUSCULAR | Status: DC | PRN
  Administered 2021-12-15: 4 mg via INTRAVENOUS

## 2021-12-15 MED ORDER — DEXAMETHASONE SODIUM PHOSPHATE 10 MG/ML IJ SOLN
INTRAMUSCULAR | Status: DC | PRN
  Administered 2021-12-15: 10 mg via INTRAVENOUS

## 2021-12-15 MED ORDER — METOCLOPRAMIDE HCL 5 MG/ML IJ SOLN: 5.0000 mg | Freq: Three times a day (TID) | INTRAMUSCULAR | Status: DC | PRN

## 2021-12-15 MED ORDER — CEFAZOLIN SODIUM-DEXTROSE 2-4 GM/100ML-% IV SOLN
2.0000 g | INTRAVENOUS | Status: DC
  Filled 2021-12-15: qty 100

## 2021-12-15 MED ORDER — ORAL CARE MOUTH RINSE: 15.0000 mL | Freq: Once | OROMUCOSAL | Status: AC

## 2021-12-15 MED ORDER — METHOCARBAMOL 500 MG PO TABS
500.0000 mg | ORAL_TABLET | Freq: Four times a day (QID) | ORAL | Status: DC | PRN
  Administered 2021-12-15 – 2021-12-16 (×2): 500 mg via ORAL
  Filled 2021-12-15 (×3): qty 1

## 2021-12-15 MED ORDER — ONDANSETRON HCL 4 MG/2ML IJ SOLN: 4.0000 mg | Freq: Four times a day (QID) | INTRAMUSCULAR | Status: DC | PRN

## 2021-12-15 MED ORDER — GABAPENTIN 100 MG PO CAPS
200.0000 mg | ORAL_CAPSULE | Freq: Three times a day (TID) | ORAL | Status: DC
  Administered 2021-12-15 – 2021-12-17 (×4): 200 mg via ORAL
  Filled 2021-12-15 (×5): qty 2

## 2021-12-15 MED ORDER — CEFAZOLIN SODIUM-DEXTROSE 2-4 GM/100ML-% IV SOLN
2.0000 g | Freq: Three times a day (TID) | INTRAVENOUS | Status: AC
  Administered 2021-12-15 – 2021-12-16 (×3): 2 g via INTRAVENOUS
  Filled 2021-12-15 (×3): qty 100

## 2021-12-15 MED ORDER — ROCURONIUM BROMIDE 10 MG/ML (PF) SYRINGE
PREFILLED_SYRINGE | INTRAVENOUS | Status: AC
  Filled 2021-12-15: qty 10

## 2021-12-15 MED ORDER — CHLORHEXIDINE GLUCONATE 0.12 % MT SOLN
OROMUCOSAL | Status: AC
  Administered 2021-12-15: 15 mL via OROMUCOSAL
  Filled 2021-12-15: qty 15

## 2021-12-15 MED ORDER — OXYCODONE HCL 5 MG PO TABS
10.0000 mg | ORAL_TABLET | ORAL | Status: DC | PRN
  Administered 2021-12-15: 10 mg via ORAL
  Administered 2021-12-16 – 2021-12-18 (×5): 15 mg via ORAL
  Filled 2021-12-15 (×5): qty 3

## 2021-12-15 MED ORDER — VANCOMYCIN HCL 1000 MG IV SOLR
INTRAVENOUS | Status: DC | PRN
Start: 2021-12-15 — End: 2021-12-15
  Administered 2021-12-15: 1000 mg via TOPICAL

## 2021-12-15 MED ORDER — VANCOMYCIN HCL 1000 MG IV SOLR
INTRAVENOUS | Status: AC
  Filled 2021-12-15: qty 40

## 2021-12-15 MED ORDER — HYDROMORPHONE HCL 1 MG/ML IJ SOLN
0.5000 mg | INTRAMUSCULAR | Status: DC | PRN
  Administered 2021-12-15 – 2021-12-20 (×5): 1 mg via INTRAVENOUS
  Filled 2021-12-15 (×5): qty 1

## 2021-12-15 MED ORDER — OXYCODONE HCL 5 MG PO TABS
5.0000 mg | ORAL_TABLET | ORAL | Status: DC | PRN
  Administered 2021-12-15 – 2021-12-16 (×2): 10 mg via ORAL
  Administered 2021-12-18: 5 mg via ORAL
  Administered 2021-12-19 (×2): 10 mg via ORAL
  Filled 2021-12-15 (×2): qty 2
  Filled 2021-12-15: qty 1
  Filled 2021-12-15 (×2): qty 2

## 2021-12-15 MED ORDER — METOCLOPRAMIDE HCL 5 MG PO TABS: 5.0000 mg | ORAL_TABLET | Freq: Three times a day (TID) | ORAL | Status: DC | PRN

## 2021-12-15 SURGICAL SUPPLY — 74 items
ADH SKN CLS APL DERMABOND .7 (GAUZE/BANDAGES/DRESSINGS) ×2
ADH SKN CLS LQ APL DERMABOND (GAUZE/BANDAGES/DRESSINGS) ×2
APL PRP STRL LF DISP 70% ISPRP (MISCELLANEOUS) ×4
BAG COUNTER SPONGE SURGICOUNT (BAG) ×3 IMPLANT
BAG SPNG CNTER NS LX DISP (BAG) ×2
BIT DRILL 2.5X110 QC LCP DISP (BIT) ×1 IMPLANT
BIT DRILL Q/COUPLING 1 (BIT) ×1 IMPLANT
BIT DRILL QC 2X125 (BIT) IMPLANT
BNDG CMPR MED 10X6 ELC LF (GAUZE/BANDAGES/DRESSINGS) ×2
BNDG COHESIVE 4X5 TAN STRL (GAUZE/BANDAGES/DRESSINGS) ×2 IMPLANT
BNDG ELASTIC 4X5.8 VLCR STR LF (GAUZE/BANDAGES/DRESSINGS) ×3 IMPLANT
BNDG ELASTIC 6X10 VLCR STRL LF (GAUZE/BANDAGES/DRESSINGS) ×1 IMPLANT
BNDG ELASTIC 6X5.8 VLCR STR LF (GAUZE/BANDAGES/DRESSINGS) ×2 IMPLANT
BRUSH SCRUB EZ PLAIN DRY (MISCELLANEOUS) ×6 IMPLANT
CHLORAPREP W/TINT 26 (MISCELLANEOUS) ×4 IMPLANT
COVER SURGICAL LIGHT HANDLE (MISCELLANEOUS) ×5 IMPLANT
DERMABOND ADHESIVE PROPEN (GAUZE/BANDAGES/DRESSINGS) ×1
DERMABOND ADVANCED (GAUZE/BANDAGES/DRESSINGS) ×1
DERMABOND ADVANCED .7 DNX12 (GAUZE/BANDAGES/DRESSINGS) ×4 IMPLANT
DERMABOND ADVANCED .7 DNX6 (GAUZE/BANDAGES/DRESSINGS) IMPLANT
DRAPE C-ARM 42X72 X-RAY (DRAPES) ×3 IMPLANT
DRAPE C-ARMOR (DRAPES) ×1 IMPLANT
DRAPE INCISE IOBAN 66X45 STRL (DRAPES) ×3 IMPLANT
DRAPE ORTHO SPLIT 77X108 STRL (DRAPES) ×6
DRAPE SURG 17X23 STRL (DRAPES) ×3 IMPLANT
DRAPE SURG ORHT 6 SPLT 77X108 (DRAPES) ×4 IMPLANT
DRAPE U-SHAPE 47X51 STRL (DRAPES) ×6 IMPLANT
DRILL BIT QC 2X125 (BIT) ×1
DRSG MEPILEX BORDER 4X8 (GAUZE/BANDAGES/DRESSINGS) ×3 IMPLANT
DRSG PAD ABDOMINAL 8X10 ST (GAUZE/BANDAGES/DRESSINGS) ×2 IMPLANT
ELECT REM PT RETURN 9FT ADLT (ELECTROSURGICAL) ×3
ELECTRODE REM PT RTRN 9FT ADLT (ELECTROSURGICAL) ×2 IMPLANT
EVACUATOR 1/8 PVC DRAIN (DRAIN) IMPLANT
GAUZE SPONGE 4X4 12PLY STRL (GAUZE/BANDAGES/DRESSINGS) ×1 IMPLANT
GAUZE XEROFORM 5X9 LF (GAUZE/BANDAGES/DRESSINGS) ×1 IMPLANT
GLOVE SURG ENC MOIS LTX SZ6.5 (GLOVE) ×9 IMPLANT
GLOVE SURG ENC MOIS LTX SZ7.5 (GLOVE) ×12 IMPLANT
GLOVE SURG UNDER POLY LF SZ6.5 (GLOVE) ×3 IMPLANT
GLOVE SURG UNDER POLY LF SZ7.5 (GLOVE) ×3 IMPLANT
GOWN STRL REUS W/ TWL LRG LVL3 (GOWN DISPOSABLE) ×4 IMPLANT
GOWN STRL REUS W/TWL LRG LVL3 (GOWN DISPOSABLE) ×6
KIT BASIN OR (CUSTOM PROCEDURE TRAY) ×3 IMPLANT
KIT TURNOVER KIT B (KITS) ×3 IMPLANT
MANIFOLD NEPTUNE II (INSTRUMENTS) ×3 IMPLANT
NDL HYPO 25X1 1.5 SAFETY (NEEDLE) ×2 IMPLANT
NEEDLE HYPO 25X1 1.5 SAFETY (NEEDLE) IMPLANT
NS IRRIG 1000ML POUR BTL (IV SOLUTION) ×3 IMPLANT
PACK ORTHO EXTREMITY (CUSTOM PROCEDURE TRAY) ×3 IMPLANT
PAD ARMBOARD 7.5X6 YLW CONV (MISCELLANEOUS) ×5 IMPLANT
PAD CAST 4YDX4 CTTN HI CHSV (CAST SUPPLIES) IMPLANT
PADDING CAST COTTON 4X4 STRL (CAST SUPPLIES) ×6
PADDING CAST COTTON 6X4 STRL (CAST SUPPLIES) ×2 IMPLANT
PLATE LOCKING 9 HOLE (Plate) ×1 IMPLANT
SCREW CORTEX 2.7 26MM (Screw) ×1 IMPLANT
SCREW CORTEX ST 4.5X24 (Screw) ×3 IMPLANT
SCREW CORTEX ST 4.5X26 (Screw) ×5 IMPLANT
SPONGE T-LAP 18X18 ~~LOC~~+RFID (SPONGE) ×4 IMPLANT
STAPLER VISISTAT 35W (STAPLE) ×2 IMPLANT
SUCTION FRAZIER HANDLE 10FR (MISCELLANEOUS) ×3
SUCTION TUBE FRAZIER 10FR DISP (MISCELLANEOUS) ×2 IMPLANT
SUT ETHILON 3 0 PS 1 (SUTURE) ×4 IMPLANT
SUT MNCRL AB 3-0 PS2 18 (SUTURE) ×2 IMPLANT
SUT PROLENE 0 CT (SUTURE) IMPLANT
SUT VIC AB 0 CT1 27 (SUTURE) ×6
SUT VIC AB 0 CT1 27XBRD ANBCTR (SUTURE) ×4 IMPLANT
SUT VIC AB 2-0 CT1 27 (SUTURE) ×6
SUT VIC AB 2-0 CT1 TAPERPNT 27 (SUTURE) ×4 IMPLANT
SYR CONTROL 10ML LL (SYRINGE) ×2 IMPLANT
TOWEL GREEN STERILE (TOWEL DISPOSABLE) ×8 IMPLANT
TOWEL GREEN STERILE FF (TOWEL DISPOSABLE) ×3 IMPLANT
TRAY FOLEY MTR SLVR 16FR STAT (SET/KITS/TRAYS/PACK) IMPLANT
TUBE CONNECTING 12X1/4 (SUCTIONS) ×3 IMPLANT
WATER STERILE IRR 1000ML POUR (IV SOLUTION) ×2 IMPLANT
YANKAUER SUCT BULB TIP NO VENT (SUCTIONS) ×1 IMPLANT

## 2021-12-15 NOTE — Progress Notes (Signed)
Patient transported to preop at this time.  No s/s of distress noted.  Family at bedside with prayer before leaving room for surgery.
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
## 2021-12-15 NOTE — Progress Notes (Signed)
O2 stats dropped to 88% on room air encouraged deep breathing and IS use palced on 2 liter O2. O2 stats now 94%. Ilean Skill LPN
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
## 2021-12-15 NOTE — Progress Notes (Signed)
PT Cancellation Note  Patient Details Name: Elizabeth Walsh MRN: [id number] DOB: 04-04-1966   Cancelled Treatment:    Reason Eval/Treat Not Completed: Patient at procedure or test/unavailable Pt off floor in OR. Will follow.   Blake Divine A Sarajane Fambrough 12/15/2021, 9:47 AM [place], PT, DPT Acute Rehabilitation Services Pager [phone] Office [phone]

## 2021-12-15 NOTE — Interval H&P Note (Signed)
History and Physical Interval Note:  12/15/2021 9:24 AM  Elizabeth Walsh  has presented today for surgery, with the diagnosis of Left humerus fracture and right foot fracture.  The various methods of treatment have been discussed with the patient and family. After consideration of risks, benefits and other options for treatment, the patient has consented to  Procedure(s): OPEN REDUCTION INTERNAL FIXATION (ORIF) HUMERAL SHAFT FRACTURE (Left) CLOSED REDUCTION PERCUTANEOUS FIXATION OF RIGHT FOOT (Right) as a surgical intervention.  The patient's history has been reviewed, patient examined, no change in status, stable for surgery.  I have reviewed the patient's chart and labs.  Questions were answered to the patient's satisfaction.     Caryn Bee P Arnetha Silverthorne
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
## 2021-12-15 NOTE — Transfer of Care (Signed)
Immediate Anesthesia Transfer of Care Note  Patient: Elizabeth Walsh  Procedure(s) Performed: OPEN REDUCTION INTERNAL FIXATION (ORIF) HUMERUS (Left: Arm Upper) CLOSED REDUCTION PERCUTANEOUS PINNING RIGHT FOOT (Right: Foot)  Patient Location: PACU  Anesthesia Type:General  Level of Consciousness: alert , drowsy, patient cooperative and responds to stimulation  Airway & Oxygen Therapy: Patient Spontanous Breathing and Patient connected to nasal cannula oxygen  Post-op Assessment: Report given to RN, Post -op Vital signs reviewed and stable and Patient moving all extremities X 4  Post vital signs: Reviewed and stable  Last Vitals:  Vitals Value Taken Time  BP 140/79 12/15/21 1204  Temp    Pulse 90 12/15/21 1205  Resp 14 12/15/21 1205  SpO2 95 % 12/15/21 1205  Vitals shown include unvalidated device data.  Last Pain:  Vitals:   12/15/21 0830  TempSrc: Oral  PainSc: 6       Patients Stated Pain Goal: 2 ([id number])  Complications: No notable events documented.

## 2021-12-15 NOTE — Progress Notes (Signed)
OT Cancellation Note  Patient Details Name: CAMERON KATAYAMA MRN: [id number] DOB: 07-Sep-1966   Cancelled Treatment:    Reason Eval/Treat Not Completed: Patient at procedure or test/ unavailable (Pt in surgery)  Evern Bio 12/15/2021, 8:17 AM Martie Round, OTR/L Acute Rehabilitation Services Pager: [phone] Office: [phone]
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
## 2021-12-15 NOTE — Plan of Care (Signed)

## 2021-12-15 NOTE — Anesthesia Procedure Notes (Signed)

## 2021-12-16 ENCOUNTER — Encounter (HOSPITAL_COMMUNITY): Payer: Self-pay

## 2021-12-16 DIAGNOSIS — E559 Vitamin D deficiency, unspecified: Secondary | ICD-10-CM

## 2021-12-16 HISTORY — DX: Vitamin D deficiency, unspecified: E55.9

## 2021-12-16 LAB — BASIC METABOLIC PANEL
Anion gap: 7 (ref 5–15)
BUN: <5 mg/dL — ABNORMAL LOW (ref 6–20)
CO2: 25 mmol/L (ref 22–32)
Calcium: 7.7 mg/dL — ABNORMAL LOW (ref 8.9–10.3)
Chloride: 109 mmol/L (ref 98–111)
Creatinine, Ser: 0.69 mg/dL (ref 0.44–1.00)
GFR, Estimated: >60 mL/min (ref >60)
Glucose, Bld: 169 mg/dL — ABNORMAL HIGH (ref 70–99)
Potassium: 3.3 mmol/L — ABNORMAL LOW (ref 3.5–5.1)
Sodium: 141 mmol/L (ref 135–145)

## 2021-12-16 LAB — CBC
HCT: 21.2 % — ABNORMAL LOW (ref 36.0–46.0)
Hemoglobin: 7.4 g/dL — ABNORMAL LOW (ref 12.0–15.0)
MCH: 32.2 pg (ref 26.0–34.0)
MCHC: 34.9 g/dL (ref 30.0–36.0)
MCV: 92.2 fL (ref 80.0–100.0)
Platelets: 129 10*3/uL — ABNORMAL LOW (ref 150–400)
RBC: 2.3 MIL/uL — ABNORMAL LOW (ref 3.87–5.11)
RDW: 15.8 % — ABNORMAL HIGH (ref 11.5–15.5)
WBC: 6.6 10*3/uL (ref 4.0–10.5)
nRBC: 0 % (ref 0.0–0.2)

## 2021-12-16 MED ORDER — ASCORBIC ACID 500 MG PO TABS
500.0000 mg | ORAL_TABLET | Freq: Every day | ORAL | Status: DC
  Administered 2021-12-16 – 2021-12-20 (×4): 500 mg via ORAL
  Filled 2021-12-16 (×5): qty 1

## 2021-12-16 MED ORDER — MELATONIN 5 MG PO TABS
5.0000 mg | ORAL_TABLET | Freq: Every evening | ORAL | Status: DC | PRN
  Administered 2021-12-16 – 2021-12-19 (×4): 5 mg via ORAL
  Filled 2021-12-16 (×4): qty 1

## 2021-12-16 MED ORDER — METHOCARBAMOL 1000 MG/10ML IJ SOLN
500.0000 mg | Freq: Three times a day (TID) | INTRAVENOUS | Status: DC
  Filled 2021-12-16: qty 5

## 2021-12-16 MED ORDER — METHOCARBAMOL 750 MG PO TABS
750.0000 mg | ORAL_TABLET | Freq: Three times a day (TID) | ORAL | Status: DC
  Administered 2021-12-16 – 2021-12-20 (×11): 750 mg via ORAL
  Filled 2021-12-16 (×12): qty 1

## 2021-12-16 MED ORDER — VITAMIN D 25 MCG (1000 UNIT) PO TABS
2000.0000 [IU] | ORAL_TABLET | Freq: Two times a day (BID) | ORAL | Status: DC
  Administered 2021-12-16 – 2021-12-20 (×8): 2000 [IU] via ORAL
  Filled 2021-12-16 (×9): qty 2

## 2021-12-16 MED ORDER — CALCIUM CITRATE 950 (200 CA) MG PO TABS
200.0000 mg | ORAL_TABLET | Freq: Two times a day (BID) | ORAL | Status: DC
  Administered 2021-12-16 – 2021-12-20 (×8): 200 mg via ORAL
  Filled 2021-12-16 (×11): qty 1
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
## 2021-12-16 NOTE — Plan of Care (Signed)
  Problem: Nutrition: Goal: Adequate nutrition will be maintained Outcome: Progressing   Problem: Pain Managment: Goal: General experience of comfort will improve Outcome: Progressing   Problem: Safety: Goal: Ability to remain free from injury will improve Outcome: Progressing   
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
## 2021-12-16 NOTE — Progress Notes (Signed)
Occupational Therapy Evaluation completed with full note to follow.  Pt did well with initial EOB mobility (mod A+2) and scooting toward HOB.  Recommend AIR. Full note to follow.    Eber Jones., OTR/L Acute Rehabilitation Services Pager [phone] Office [phone]

## 2021-12-16 NOTE — Progress Notes (Signed)
Inpatient Rehab Admissions Coordinator Note:   Per PT/OT patient was screened for CIR candidacy by Alfreddie Consalvo Luvenia Starch, CCC-SLP. At this time, pt appears to be a potential candidate for CIR. I will place an order for rehab consult for full assessment, per our protocol.  Please contact me any with questions.Elizabeth Phoenix, MS, CCC-SLP Admissions Coordinator [phone] 12/16/21 5:33 PM
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
## 2021-12-17 LAB — CBC
HCT: 21.5 % — ABNORMAL LOW (ref 36.0–46.0)
Hemoglobin: 7.3 g/dL — ABNORMAL LOW (ref 12.0–15.0)
MCH: 31.3 pg (ref 26.0–34.0)
MCHC: 34 g/dL (ref 30.0–36.0)
MCV: 92.3 fL (ref 80.0–100.0)
Platelets: 146 10*3/uL — ABNORMAL LOW (ref 150–400)
RBC: 2.33 MIL/uL — ABNORMAL LOW (ref 3.87–5.11)
RDW: 15.1 % (ref 11.5–15.5)
WBC: 9.4 10*3/uL (ref 4.0–10.5)
nRBC: 0 % (ref 0.0–0.2)

## 2021-12-17 LAB — BASIC METABOLIC PANEL
Anion gap: 7 (ref 5–15)
BUN: 6 mg/dL (ref 6–20)
CO2: 25 mmol/L (ref 22–32)
Calcium: 7.9 mg/dL — ABNORMAL LOW (ref 8.9–10.3)
Chloride: 110 mmol/L (ref 98–111)
Creatinine, Ser: 0.67 mg/dL (ref 0.44–1.00)
GFR, Estimated: >60 mL/min (ref >60)
Glucose, Bld: 105 mg/dL — ABNORMAL HIGH (ref 70–99)
Potassium: 3.3 mmol/L — ABNORMAL LOW (ref 3.5–5.1)
Sodium: 142 mmol/L (ref 135–145)

## 2021-12-17 LAB — BPAM RBC
Blood Product Expiration Date: 202302162359
Blood Product Expiration Date: 202302172359
Blood Product Expiration Date: 202302212359
ISSUE DATE / TIME: 202301261757
ISSUE DATE / TIME: 202301270929
ISSUE DATE / TIME: 202301270929
Unit Type and Rh: 7300
Unit Type and Rh: 7300
Unit Type and Rh: 7300

## 2021-12-17 LAB — TYPE AND SCREEN
ABO/RH(D): B POS
Antibody Screen: NEGATIVE
Unit division: 0
Unit division: 0
Unit division: 0

## 2021-12-17 MED ORDER — GABAPENTIN 300 MG PO CAPS
300.0000 mg | ORAL_CAPSULE | Freq: Three times a day (TID) | ORAL | Status: DC
  Administered 2021-12-17 – 2021-12-20 (×9): 300 mg via ORAL
  Filled 2021-12-17 (×9): qty 1

## 2021-12-17 MED ORDER — POTASSIUM CHLORIDE 10 MEQ/100ML IV SOLN
10.0000 meq | INTRAVENOUS | Status: AC
  Administered 2021-12-17 (×3): 10 meq via INTRAVENOUS
  Filled 2021-12-17 (×3): qty 100
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
## 2021-12-17 NOTE — Progress Notes (Signed)
Inpatient Rehab Admissions:  Inpatient Rehab Consult received.  I met with patient and children at the bedside for rehabilitation assessment and to discuss goals and expectations of an inpatient rehab admission.  All acknowledged  understanding of CIR goals and expectations. All interested in CIR for pt. Will continue to follow.  Signed: Gayland Curry, [place], [place] Admissions Coordinator [phone]

## 2021-12-18 ENCOUNTER — Encounter (HOSPITAL_COMMUNITY): Payer: Self-pay | Admitting: Student

## 2021-12-18 LAB — CBC
HCT: 22.6 % — ABNORMAL LOW (ref 36.0–46.0)
Hemoglobin: 7.6 g/dL — ABNORMAL LOW (ref 12.0–15.0)
MCH: 31.4 pg (ref 26.0–34.0)
MCHC: 33.6 g/dL (ref 30.0–36.0)
MCV: 93.4 fL (ref 80.0–100.0)
Platelets: 196 10*3/uL (ref 150–400)
RBC: 2.42 MIL/uL — ABNORMAL LOW (ref 3.87–5.11)
RDW: 15 % (ref 11.5–15.5)
WBC: 8.1 10*3/uL (ref 4.0–10.5)
nRBC: 1 % — ABNORMAL HIGH (ref 0.0–0.2)

## 2021-12-18 LAB — BASIC METABOLIC PANEL
Anion gap: 7 (ref 5–15)
BUN: 6 mg/dL (ref 6–20)
CO2: 23 mmol/L (ref 22–32)
Calcium: 7.7 mg/dL — ABNORMAL LOW (ref 8.9–10.3)
Chloride: 113 mmol/L — ABNORMAL HIGH (ref 98–111)
Creatinine, Ser: 0.6 mg/dL (ref 0.44–1.00)
GFR, Estimated: >60 mL/min (ref >60)
Glucose, Bld: 95 mg/dL (ref 70–99)
Potassium: 3.3 mmol/L — ABNORMAL LOW (ref 3.5–5.1)
Sodium: 143 mmol/L (ref 135–145)

## 2021-12-18 LAB — CALCIUM, IONIZED: Calcium, Ionized, Serum: 5.1 mg/dL (ref 4.5–5.6)

## 2021-12-18 MED ORDER — LISINOPRIL 20 MG PO TABS
20.0000 mg | ORAL_TABLET | Freq: Every day | ORAL | Status: DC
  Administered 2021-12-18 – 2021-12-20 (×5): 20 mg via ORAL
  Filled 2021-12-18 (×5): qty 1

## 2021-12-18 MED ORDER — HYDROCHLOROTHIAZIDE 25 MG PO TABS
25.0000 mg | ORAL_TABLET | Freq: Every day | ORAL | Status: DC
  Administered 2021-12-18 – 2021-12-20 (×5): 25 mg via ORAL
  Filled 2021-12-18 (×5): qty 1

## 2021-12-18 MED ORDER — POTASSIUM CHLORIDE 10 MEQ/100ML IV SOLN
10.0000 meq | INTRAVENOUS | Status: AC
  Administered 2021-12-18 (×5): 10 meq via INTRAVENOUS
  Filled 2021-12-18 (×5): qty 100

## 2021-12-18 MED ORDER — DEXTROSE-NACL 5-0.45 % IV SOLN: INTRAVENOUS | Status: DC

## 2021-12-18 NOTE — Anesthesia Postprocedure Evaluation (Signed)
Anesthesia Post Note  Patient: Elizabeth Walsh  Procedure(s) Performed: OPEN REDUCTION INTERNAL FIXATION (ORIF) HUMERUS (Left: Arm Upper) CLOSED REDUCTION PERCUTANEOUS PINNING RIGHT FOOT (Right: Foot)     Patient location during evaluation: PACU Anesthesia Type: General Level of consciousness: awake and alert Pain management: pain level controlled Vital Signs Assessment: post-procedure vital signs reviewed and stable Respiratory status: spontaneous breathing, nonlabored ventilation, respiratory function stable and patient connected to nasal cannula oxygen Cardiovascular status: blood pressure returned to baseline and stable Postop Assessment: no apparent nausea or vomiting Anesthetic complications: no   No notable events documented.  Last Vitals:  Vitals:   12/18/21 0500 12/18/21 0501  BP:  (!) 177/87  Pulse:  65  Resp:  16  Temp: (!) 36.3 C   SpO2:  94%    Last Pain:  Vitals:   12/18/21 0700  TempSrc:   PainSc: 6                  Jakaylee Sasaki P Athelene Hursey

## 2021-12-18 NOTE — Progress Notes (Signed)
Orthopedic Tech Progress Note Patient Details:  Elizabeth Walsh November 27, 1965 [id number]  Called in order to [employer] for a WRIST COCKUP SPLINT   Patient ID: Elizabeth Walsh, female   DOB: 1965-11-23, 56 y.o.   MRN: [id number]  [name] 12/18/2021, 4:02 PM
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
## 2021-12-18 NOTE — Progress Notes (Addendum)
Orthopaedic Trauma Progress Note  SUBJECTIVE: Doing ok today. Pain manageable. No chest pain. No SOB. No nausea/vomiting. No other complaints. Asking for splint for left radial nerve palsy. I will reach out to OT today about getting this made. Family member at bedside  OBJECTIVE:  Vitals:   12/18/21 0501 12/18/21 0752  BP: (!) 177/87 (!) 161/87  Pulse: 65 76  Resp: 16 17  Temp:  98.3 F (36.8 C)  SpO2: 94% 99%    General: Laying in bed, NAD Respiratory: No increased work of breathing.  RLE: Short leg splint in place. Dressings over hip and thigh CDI. Able to wiggle toes. Skin warm and dry. Endorses sensation over toes LUE: Sling not currently in place. Dressing removed, incisions CDI. Bruising antecubital fossa. Swelling appropriate. Unable to actively extend thumb or wrist, unchanged from pre-op exam. Sensation and motor function to the median and ulnar nerve intact. Compartments soft and compressible. +radial pulse  IMAGING: Stable post op imaging.   LABS:  Results for orders placed or performed during the hospital encounter of 12/13/21 (from the past 24 hour(s))  CBC     Status: Abnormal   Collection Time: 12/18/21  4:55 AM  Result Value Ref Range   WBC 8.1 4.0 - 10.5 K/uL   RBC 2.42 (L) 3.87 - 5.11 MIL/uL   Hemoglobin 7.6 (L) 12.0 - 15.0 g/dL   HCT 56.3 (L) 14.9 - 70.2 %   MCV 93.4 80.0 - 100.0 fL   MCH 31.4 26.0 - 34.0 pg   MCHC 33.6 30.0 - 36.0 g/dL   RDW 63.7 85.8 - 85.0 %   Platelets 196 150 - 400 K/uL   nRBC 1.0 (H) 0.0 - 0.2 %  Basic metabolic panel     Status: Abnormal   Collection Time: 12/18/21  4:55 AM  Result Value Ref Range   Sodium 143 135 - 145 mmol/L   Potassium 3.3 (L) 3.5 - 5.1 mmol/L   Chloride 113 (H) 98 - 111 mmol/L   CO2 23 22 - 32 mmol/L   Glucose, Bld 95 70 - 99 mg/dL   BUN 6 6 - 20 mg/dL   Creatinine, Ser 2.77 0.44 - 1.00 mg/dL   Calcium 7.7 (L) 8.9 - 10.3 mg/dL   GFR, Estimated >41 >28 mL/min   Anion gap 7 5 - 15    ASSESSMENT: Elizabeth Walsh is a 56 y.o. female, 3 Days Post-Op s/p ORIF LEFT HUMERUS by Dr. Jena Gauss 12/15/21 CLOSED REDUCTION PERCUTANEOUS PINNING RIGHT FOOT  Dr. Jena Gauss 12/15/21 IRRIGATION & DEBRIDEMENT WITH IMN RIGHT FEMUR by Dr. Blanchie Dessert 12/13/21  CV/Blood loss: Acute blood loss anemia, Hgb 7.6 this morning. Hemodynamically stable  PLAN: Weightbearing:  - RLE: NWB - LUE: WBAT Incisional and dressing care:  - RLE: splint left in place - LUE: change PRN Showering: Ok to show, keep splint dry. LUE incision may get wet Orthopedic device(s):  - RLE: short leg splint - LUE: sling PRN for comfort. Radial nerve palsy splint  Pain management:  1. Tylenol 1000 mg q 6 hours scheduled 2. Robaxin 750 mg TID 3. Oxycodone 5-15 mg q 4 hours PRN 4. Neurontin 300 mg TID 5. Dilaudid 0.5-1 mg q 4 hours PRN VTE prophylaxis: Lovenox, SCDs ID: Ancef 2gm post op completed Foley/Lines:  No foley, KVO IVFs Impediments to Fracture Healing: Vitamin D level 19, started on D3 supplementation. Dispo: Therapies as tolerated, PT/OT recommending CIR.  Patient and her family are interested in this option and would like to  pursue CIR if able.  Admission coordinator has been consulted and is following. Okay for discharge from ortho standpoint once cleared by trauma team and therapies D/C recommendation: - Continue Lovenox while inpatient, likely transition to Eliquis at d/c home -Continue D3 supplementation x90 days Follow - up plan: We will continue to follow while in hospital and plan for outpatient follow-up 2 weeks after discharge for repeat x-rays and wound check  Contact information:  Truitt Merle MD, Thyra Breed PA-C. After hours and holidays please check [email or web address] for group call information for [facility]   Thompson Caul, PA-C [phone] (office) [email or web address]

## 2021-12-18 NOTE — Progress Notes (Signed)
Pt BP (!) 177/87 (BP Location: Left Leg)    Pulse 65    Temp (!) 97.4 F (36.3 C) (Oral)    Resp 16    Ht 5\' 4"  (1.626 m)    Wt 86.2 kg    SpO2 94%    BMI 32.61 kg/m  Pt inquried about starting BP meds, paged Dr. , Dr. Sheliah Hatch ordered pts. lisinopril-hydrochlorothiazide (ZESTORETIC) 20-25 MG tablet, came as two pills. Pt received first does approximately 7am Sinda Du 12/18/21 7:03 AM
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
## 2021-12-19 ENCOUNTER — Inpatient Hospital Stay (HOSPITAL_COMMUNITY): Payer: 59

## 2021-12-19 LAB — BASIC METABOLIC PANEL
Anion gap: 7 (ref 5–15)
BUN: 7 mg/dL (ref 6–20)
CO2: 24 mmol/L (ref 22–32)
Calcium: 8.1 mg/dL — ABNORMAL LOW (ref 8.9–10.3)
Chloride: 112 mmol/L — ABNORMAL HIGH (ref 98–111)
Creatinine, Ser: 0.58 mg/dL (ref 0.44–1.00)
GFR, Estimated: >60 mL/min (ref >60)
Glucose, Bld: 100 mg/dL — ABNORMAL HIGH (ref 70–99)
Potassium: 4 mmol/L (ref 3.5–5.1)
Sodium: 143 mmol/L (ref 135–145)

## 2021-12-19 MED ORDER — POLYETHYLENE GLYCOL 3350 17 G PO PACK
17.0000 g | PACK | Freq: Every day | ORAL | Status: DC
  Administered 2021-12-19 – 2021-12-20 (×2): 17 g via ORAL
  Filled 2021-12-19 (×2): qty 1

## 2021-12-19 NOTE — Progress Notes (Signed)
Inpatient Rehab Admissions Coordinator:   I do not yet have insurance auth for this Pt. For CIR. I will continue to follow for potential admit pending insurance authorization.   Megan Salon, MS, CCC-SLP Rehab Admissions Coordinator  [phone] (celll) [phone] (office)
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
## 2021-12-19 NOTE — Progress Notes (Signed)
OT Note  Pt seen for splint check and ROM LUE. Full note to follow.  Maurie Boettcher, OT/L   Acute OT Clinical Specialist Acute Rehabilitation Services Pager [phone] Office [phone]
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
## 2021-12-19 NOTE — Progress Notes (Signed)
Orthopedic Tech Progress Note Patient Details:  Elizabeth Walsh [birth year] [id number]  Ortho Devices Type of Ortho Device: CAM walker Ortho Device/Splint Location: LLE Ortho Device/Splint Interventions: Ordered, Application, Adjustment   Post Interventions Patient Tolerated: Well Instructions Provided: Care of device  [name] 12/19/2021, 3:44 PM

## 2021-12-19 NOTE — Progress Notes (Addendum)
Mobility Specialist Progress Note:   12/19/21 1700  Mobility  Activity Transferred from chair to bed  Level of Assistance Maximum assist, patient does 25-49%  Assistive Device Other (Comment) (chuck pads)  LUE Weight Bearing WBAT  RLE Weight Bearing NWB  LLE Weight Bearing NWB  Activity Response Tolerated fair  $Mobility charge 1 Mobility   Pt requesting to get back to bed by posterior/anterior scoot with 3+ using chuck pads to assist pt. Pt able to WB through LUE and scoot forward from chair to bed. Requiring maxA once on bed to get pt comfortable. Left in bed with bed alarm on, family members present.   Nelta Numbers Mobility Specialist  Phone [phone]

## 2021-12-20 ENCOUNTER — Encounter (HOSPITAL_COMMUNITY): Payer: Self-pay | Admitting: Physical Medicine and Rehabilitation

## 2021-12-20 ENCOUNTER — Inpatient Hospital Stay (HOSPITAL_COMMUNITY)
Admission: RE | Admit: 2021-12-20 | Discharge: 2022-01-02 | DRG: 560 | Disposition: A | Discharge status: Home Health | Payer: 59 | Source: Intra-hospital | Attending: Physical Medicine and Rehabilitation | Admitting: Physical Medicine and Rehabilitation

## 2021-12-20 ENCOUNTER — Other Ambulatory Visit: Payer: Self-pay

## 2021-12-20 DIAGNOSIS — S7291XD Unspecified fracture of right femur, subsequent encounter for closed fracture with routine healing: Principal | ICD-10-CM

## 2021-12-20 DIAGNOSIS — K5900 Constipation, unspecified: Secondary | ICD-10-CM | POA: Diagnosis not present

## 2021-12-20 DIAGNOSIS — S42302D Unspecified fracture of shaft of humerus, left arm, subsequent encounter for fracture with routine healing: Secondary | ICD-10-CM | POA: Diagnosis not present

## 2021-12-20 DIAGNOSIS — S8252XD Displaced fracture of medial malleolus of left tibia, subsequent encounter for closed fracture with routine healing: Secondary | ICD-10-CM

## 2021-12-20 DIAGNOSIS — T1490XA Injury, unspecified, initial encounter: Secondary | ICD-10-CM | POA: Diagnosis not present

## 2021-12-20 DIAGNOSIS — F17210 Nicotine dependence, cigarettes, uncomplicated: Secondary | ICD-10-CM | POA: Diagnosis present

## 2021-12-20 DIAGNOSIS — E559 Vitamin D deficiency, unspecified: Secondary | ICD-10-CM | POA: Diagnosis present

## 2021-12-20 DIAGNOSIS — S0003XD Contusion of scalp, subsequent encounter: Secondary | ICD-10-CM | POA: Diagnosis not present

## 2021-12-20 DIAGNOSIS — Z79899 Other long term (current) drug therapy: Secondary | ICD-10-CM

## 2021-12-20 DIAGNOSIS — D62 Acute posthemorrhagic anemia: Secondary | ICD-10-CM | POA: Diagnosis present

## 2021-12-20 DIAGNOSIS — E669 Obesity, unspecified: Secondary | ICD-10-CM | POA: Diagnosis present

## 2021-12-20 DIAGNOSIS — Z6836 Body mass index (BMI) 36.0-36.9, adult: Secondary | ICD-10-CM

## 2021-12-20 DIAGNOSIS — R609 Edema, unspecified: Secondary | ICD-10-CM | POA: Diagnosis not present

## 2021-12-20 DIAGNOSIS — T148XXA Other injury of unspecified body region, initial encounter: Secondary | ICD-10-CM

## 2021-12-20 DIAGNOSIS — S9304XA Dislocation of right ankle joint, initial encounter: Secondary | ICD-10-CM

## 2021-12-20 DIAGNOSIS — S2243XD Multiple fractures of ribs, bilateral, subsequent encounter for fracture with routine healing: Secondary | ICD-10-CM | POA: Diagnosis not present

## 2021-12-20 DIAGNOSIS — I959 Hypotension, unspecified: Secondary | ICD-10-CM | POA: Diagnosis not present

## 2021-12-20 DIAGNOSIS — G5632 Lesion of radial nerve, left upper limb: Secondary | ICD-10-CM | POA: Diagnosis present

## 2021-12-20 DIAGNOSIS — S92901D Unspecified fracture of right foot, subsequent encounter for fracture with routine healing: Secondary | ICD-10-CM

## 2021-12-20 DIAGNOSIS — S72351B Displaced comminuted fracture of shaft of right femur, initial encounter for open fracture type I or II: Secondary | ICD-10-CM

## 2021-12-20 DIAGNOSIS — G4700 Insomnia, unspecified: Secondary | ICD-10-CM | POA: Diagnosis not present

## 2021-12-20 MED ORDER — ALUM & MAG HYDROXIDE-SIMETH 200-200-20 MG/5ML PO SUSP: 30.0000 mL | ORAL | Status: DC | PRN

## 2021-12-20 MED ORDER — ACETAMINOPHEN 325 MG PO TABS
325.0000 mg | ORAL_TABLET | ORAL | Status: DC | PRN
  Administered 2021-12-20 – 2021-12-21 (×2): 650 mg via ORAL
  Filled 2021-12-20 (×2): qty 2

## 2021-12-20 MED ORDER — TRAZODONE HCL 50 MG PO TABS: 25.0000 mg | ORAL_TABLET | Freq: Every evening | ORAL | Status: DC | PRN

## 2021-12-20 MED ORDER — OXYCODONE HCL 5 MG PO TABS
10.0000 mg | ORAL_TABLET | ORAL | Status: DC | PRN
  Administered 2021-12-20 – 2021-12-31 (×14): 10 mg via ORAL
  Filled 2021-12-20 (×14): qty 2

## 2021-12-20 MED ORDER — POLYETHYLENE GLYCOL 3350 17 G PO PACK
17.0000 g | PACK | Freq: Every day | ORAL | Status: DC
  Administered 2021-12-22 – 2022-01-01 (×8): 17 g via ORAL
  Filled 2021-12-20 (×12): qty 1

## 2021-12-20 MED ORDER — HYDROCHLOROTHIAZIDE 25 MG PO TABS
25.0000 mg | ORAL_TABLET | Freq: Every day | ORAL | Status: DC
  Administered 2021-12-21 – 2021-12-22 (×2): 25 mg via ORAL
  Filled 2021-12-20 (×3): qty 1

## 2021-12-20 MED ORDER — PROCHLORPERAZINE MALEATE 5 MG PO TABS: 5.0000 mg | ORAL_TABLET | Freq: Four times a day (QID) | ORAL | Status: DC | PRN

## 2021-12-20 MED ORDER — PROCHLORPERAZINE EDISYLATE 10 MG/2ML IJ SOLN: 5.0000 mg | Freq: Four times a day (QID) | INTRAMUSCULAR | Status: DC | PRN

## 2021-12-20 MED ORDER — DOCUSATE SODIUM 100 MG PO CAPS
100.0000 mg | ORAL_CAPSULE | Freq: Two times a day (BID) | ORAL | Status: DC
  Administered 2021-12-20 – 2022-01-01 (×23): 100 mg via ORAL
  Filled 2021-12-20 (×25): qty 1

## 2021-12-20 MED ORDER — GABAPENTIN 300 MG PO CAPS
300.0000 mg | ORAL_CAPSULE | Freq: Three times a day (TID) | ORAL | Status: DC
  Administered 2021-12-20 – 2021-12-21 (×3): 300 mg via ORAL
  Filled 2021-12-20 (×3): qty 1

## 2021-12-20 MED ORDER — VITAMIN D 25 MCG (1000 UNIT) PO TABS: 2000.0000 [IU] | ORAL_TABLET | Freq: Two times a day (BID) | ORAL | Status: DC

## 2021-12-20 MED ORDER — PROCHLORPERAZINE 25 MG RE SUPP: 12.5000 mg | Freq: Four times a day (QID) | RECTAL | Status: DC | PRN

## 2021-12-20 MED ORDER — PANTOPRAZOLE SODIUM 40 MG PO TBEC
40.0000 mg | DELAYED_RELEASE_TABLET | Freq: Every day | ORAL | Status: DC
  Administered 2021-12-21 – 2022-01-01 (×12): 40 mg via ORAL
  Filled 2021-12-20 (×13): qty 1

## 2021-12-20 MED ORDER — LISINOPRIL 20 MG PO TABS
20.0000 mg | ORAL_TABLET | Freq: Every day | ORAL | Status: DC
  Administered 2021-12-21 – 2021-12-24 (×3): 20 mg via ORAL
  Filled 2021-12-20 (×4): qty 1

## 2021-12-20 MED ORDER — ASCORBIC ACID 500 MG PO TABS
500.0000 mg | ORAL_TABLET | Freq: Every day | ORAL | Status: DC
  Administered 2021-12-21: 500 mg via ORAL
  Filled 2021-12-20: qty 1

## 2021-12-20 MED ORDER — POLYETHYLENE GLYCOL 3350 17 G PO PACK: 17.0000 g | PACK | Freq: Every day | ORAL | Status: DC | PRN

## 2021-12-20 MED ORDER — CALCIUM CITRATE 950 (200 CA) MG PO TABS
200.0000 mg | ORAL_TABLET | Freq: Two times a day (BID) | ORAL | Status: DC
  Administered 2021-12-20 – 2022-01-01 (×24): 200 mg via ORAL
  Filled 2021-12-20 (×29): qty 1

## 2021-12-20 MED ORDER — GUAIFENESIN-DM 100-10 MG/5ML PO SYRP: 5.0000 mL | ORAL_SOLUTION | Freq: Four times a day (QID) | ORAL | Status: DC | PRN

## 2021-12-20 MED ORDER — DIPHENHYDRAMINE HCL 12.5 MG/5ML PO ELIX: 12.5000 mg | ORAL_SOLUTION | Freq: Four times a day (QID) | ORAL | Status: DC | PRN

## 2021-12-20 MED ORDER — VITAMIN D (ERGOCALCIFEROL) 1.25 MG (50000 UNIT) PO CAPS
50000.0000 [IU] | ORAL_CAPSULE | ORAL | Status: DC
  Administered 2021-12-20 – 2021-12-27 (×2): 50000 [IU] via ORAL
  Filled 2021-12-20 (×3): qty 1

## 2021-12-20 MED ORDER — MELATONIN 5 MG PO TABS
5.0000 mg | ORAL_TABLET | Freq: Every evening | ORAL | Status: DC | PRN
  Administered 2021-12-21 – 2021-12-23 (×3): 5 mg via ORAL
  Filled 2021-12-20 (×3): qty 1

## 2021-12-20 MED ORDER — POLYSACCHARIDE IRON COMPLEX 150 MG PO CAPS
150.0000 mg | ORAL_CAPSULE | Freq: Two times a day (BID) | ORAL | Status: DC
  Administered 2021-12-21 – 2022-01-01 (×20): 150 mg via ORAL
  Filled 2021-12-20 (×22): qty 1

## 2021-12-20 MED ORDER — METHOCARBAMOL 750 MG PO TABS
750.0000 mg | ORAL_TABLET | Freq: Four times a day (QID) | ORAL | Status: DC
  Administered 2021-12-20 – 2021-12-21 (×3): 750 mg via ORAL
  Filled 2021-12-20 (×3): qty 1

## 2021-12-20 MED ORDER — ENSURE ENLIVE PO LIQD
237.0000 mL | Freq: Two times a day (BID) | ORAL | Status: DC
  Administered 2021-12-20 – 2022-01-01 (×21): 237 mL via ORAL

## 2021-12-20 MED ORDER — ENOXAPARIN SODIUM 30 MG/0.3ML IJ SOSY
30.0000 mg | PREFILLED_SYRINGE | Freq: Two times a day (BID) | INTRAMUSCULAR | Status: DC
  Administered 2021-12-20 – 2021-12-21 (×2): 30 mg via SUBCUTANEOUS
  Filled 2021-12-20 (×2): qty 0.3

## 2021-12-20 MED ORDER — ENOXAPARIN SODIUM 40 MG/0.4ML IJ SOSY: 40.0000 mg | PREFILLED_SYRINGE | INTRAMUSCULAR | Status: DC

## 2021-12-20 MED ORDER — BISACODYL 10 MG RE SUPP: 10.0000 mg | Freq: Every day | RECTAL | Status: DC | PRN

## 2021-12-20 MED ORDER — POTASSIUM CHLORIDE CRYS ER 20 MEQ PO TBCR
20.0000 meq | EXTENDED_RELEASE_TABLET | Freq: Every day | ORAL | Status: DC
  Administered 2021-12-20 – 2022-01-01 (×13): 20 meq via ORAL
  Filled 2021-12-20 (×13): qty 1

## 2021-12-20 MED ORDER — ACETAMINOPHEN 325 MG PO TABS
650.0000 mg | ORAL_TABLET | Freq: Three times a day (TID) | ORAL | Status: DC
  Administered 2021-12-20 – 2021-12-21 (×3): 650 mg via ORAL
  Filled 2021-12-20 (×3): qty 2

## 2021-12-20 MED ORDER — FLEET ENEMA 7-19 GM/118ML RE ENEM: 1.0000 | ENEMA | Freq: Once | RECTAL | Status: DC | PRN

## 2021-12-20 NOTE — Progress Notes (Signed)
Report called to autum,RN nurse that will be taking care of patient in 5c-16. Nurse verbalized understanding of report and had no further question. Patient will be transferred soon
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
## 2021-12-20 NOTE — Progress Notes (Signed)
Inpatient Rehabilitation Admission Medication Review by a Pharmacist  A complete drug regimen review was completed for this patient to identify any potential clinically significant medication issues.  High Risk Drug Classes Is patient taking? Indication by Medication  Antipsychotic Yes Compazine- N/V  Anticoagulant Yes Lovenox- VTE prophylaxis  Antibiotic No   Opioid Yes OxyIR- post-op pain  Antiplatelet No   Hypoglycemics/insulin No   Vasoactive Medication Yes Lisinopril, HCTZ- hypertension  Chemotherapy No   Other Yes Lipitor- HLD Trazodone, melatonin- sleep Protonix- GERD K-Dur- potassium supplementation Gabapentin- neuropathic pain Robaxin- muscle spasms     Type of Medication Issue Identified Description of Issue Recommendation(s)  Drug Interaction(s) (clinically significant)     Duplicate Therapy     Allergy     No Medication Administration End Date     Incorrect Dose     Additional Drug Therapy Needed     Significant med changes from prior encounter (inform family/care partners about these prior to discharge).    Other       Clinically significant medication issues were identified that warrant physician communication and completion of prescribed/recommended actions by midnight of the next day:  No  Name of provider notified for urgent issues identified:   Provider Method of Notification:     Pharmacist comments:   Time spent performing this drug regimen review (minutes):  30   Jaleea Alesi BS, PharmD, BCPS Clinical Pharmacist 12/20/2021 1:47 PM
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
## 2021-12-20 NOTE — Progress Notes (Signed)
Patient transferred to 5C-16 via bed with all belongings.
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
## 2021-12-20 NOTE — TOC Transition Note (Signed)
Transition of Care [facility]) - CM/SW Discharge Note   Patient Details  Name: Elizabeth Walsh MRN: [id number] Date of Birth: [birth year]  [facility] CM/SW Contact:  Ella Bodo, RN Phone Number: 12/20/2021, 12:43 PM   Clinical Narrative:   Pt medically stable for discharge and insurance authorization has been received for CIR.  Plan dc to [facility] upon bed being ready.    Final next level of care: IP Rehab Facility Barriers to Discharge: Continued Medical Work up   Patient Goals and CMS Choice Patient states their goals for this hospitalization and ongoing recovery are:: to go home CMS Medicare.gov Compare Post Acute Care list provided to:: Patient Choice offered to / list presented to : Patient                        Discharge Plan and Services     Post Acute Care Choice: IP Rehab                               Social Determinants of Health (SDOH) Interventions     Readmission Risk Interventions No flowsheet data found.  Reinaldo Raddle, RN, BSN  Trauma/Neuro ICU Case Manager [phone]

## 2021-12-20 NOTE — Progress Notes (Signed)
Inpatient Rehab Admissions Coordinator:   I have a bed for this pt. Today on CIR. RN may call report to [phone].  Clemens Catholic, [place], [place] Admissions Coordinator  [phone] ([place]) [phone] (office)

## 2021-12-21 ENCOUNTER — Inpatient Hospital Stay (HOSPITAL_COMMUNITY): Payer: 59

## 2021-12-21 ENCOUNTER — Other Ambulatory Visit (HOSPITAL_COMMUNITY): Payer: Self-pay

## 2021-12-21 DIAGNOSIS — T1490XA Injury, unspecified, initial encounter: Secondary | ICD-10-CM | POA: Diagnosis not present

## 2021-12-21 DIAGNOSIS — R609 Edema, unspecified: Secondary | ICD-10-CM | POA: Diagnosis not present

## 2021-12-21 LAB — CBC WITH DIFFERENTIAL/PLATELET
Abs Immature Granulocytes: 0.22 10*3/uL — ABNORMAL HIGH (ref 0.00–0.07)
Basophils Absolute: 0 10*3/uL (ref 0.0–0.1)
Basophils Relative: 0 %
Eosinophils Absolute: 0.4 10*3/uL (ref 0.0–0.5)
Eosinophils Relative: 6 %
HCT: 25 % — ABNORMAL LOW (ref 36.0–46.0)
Hemoglobin: 8.1 g/dL — ABNORMAL LOW (ref 12.0–15.0)
Immature Granulocytes: 3 %
Lymphocytes Relative: 29 %
Lymphs Abs: 2.1 10*3/uL (ref 0.7–4.0)
MCH: 30.8 pg (ref 26.0–34.0)
MCHC: 32.4 g/dL (ref 30.0–36.0)
MCV: 95.1 fL (ref 80.0–100.0)
Monocytes Absolute: 0.5 10*3/uL (ref 0.1–1.0)
Monocytes Relative: 7 %
Neutro Abs: 4.1 10*3/uL (ref 1.7–7.7)
Neutrophils Relative %: 55 %
Platelets: 287 10*3/uL (ref 150–400)
RBC: 2.63 MIL/uL — ABNORMAL LOW (ref 3.87–5.11)
RDW: 14.9 % (ref 11.5–15.5)
WBC: 7.4 10*3/uL (ref 4.0–10.5)
nRBC: 0.4 % — ABNORMAL HIGH (ref 0.0–0.2)

## 2021-12-21 LAB — COMPREHENSIVE METABOLIC PANEL
ALT: 61 U/L — ABNORMAL HIGH (ref 0–44)
AST: 63 U/L — ABNORMAL HIGH (ref 15–41)
Albumin: 1.9 g/dL — ABNORMAL LOW (ref 3.5–5.0)
Alkaline Phosphatase: 65 U/L (ref 38–126)
Anion gap: 6 (ref 5–15)
BUN: 7 mg/dL (ref 6–20)
CO2: 26 mmol/L (ref 22–32)
Calcium: 8.4 mg/dL — ABNORMAL LOW (ref 8.9–10.3)
Chloride: 108 mmol/L (ref 98–111)
Creatinine, Ser: 0.64 mg/dL (ref 0.44–1.00)
GFR, Estimated: >60 mL/min (ref >60)
Glucose, Bld: 104 mg/dL — ABNORMAL HIGH (ref 70–99)
Potassium: 4 mmol/L (ref 3.5–5.1)
Sodium: 140 mmol/L (ref 135–145)
Total Bilirubin: 1.1 mg/dL (ref 0.3–1.2)
Total Protein: 4.8 g/dL — ABNORMAL LOW (ref 6.5–8.1)

## 2021-12-21 MED ORDER — GABAPENTIN 400 MG PO CAPS
400.0000 mg | ORAL_CAPSULE | Freq: Three times a day (TID) | ORAL | Status: DC
  Administered 2021-12-21 – 2022-01-01 (×32): 400 mg via ORAL
  Filled 2021-12-21 (×35): qty 1

## 2021-12-21 MED ORDER — ASCORBIC ACID 500 MG PO TABS
1000.0000 mg | ORAL_TABLET | Freq: Every day | ORAL | Status: DC
  Administered 2021-12-22 – 2022-01-01 (×11): 1000 mg via ORAL
  Filled 2021-12-21 (×12): qty 2

## 2021-12-21 MED ORDER — METHOCARBAMOL 500 MG PO TABS
1000.0000 mg | ORAL_TABLET | Freq: Four times a day (QID) | ORAL | Status: DC
  Administered 2021-12-21 – 2021-12-22 (×6): 1000 mg via ORAL
  Filled 2021-12-21 (×6): qty 2

## 2021-12-21 MED ORDER — AMITRIPTYLINE HCL 10 MG PO TABS
10.0000 mg | ORAL_TABLET | Freq: Every day | ORAL | Status: DC
  Administered 2021-12-21: 10 mg via ORAL
  Filled 2021-12-21 (×2): qty 1

## 2021-12-21 MED ORDER — APIXABAN 5 MG PO TABS
5.0000 mg | ORAL_TABLET | Freq: Two times a day (BID) | ORAL | Status: DC
  Administered 2021-12-21 – 2021-12-22 (×2): 5 mg via ORAL
  Filled 2021-12-21 (×2): qty 1
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
## 2021-12-21 NOTE — TOC Benefit Eligibility Note (Addendum)
Patient Product/process development scientist completed.    The patient is currently admitted and upon discharge could be taking Eliquis 5 mg.  The current 30 day co-pay is, $60.00.   The patient is currently admitted and upon discharge could be taking Xarelto 20mg .  The current 30 day co-pay is, $60.00.   The patient is insured through     [employer], CPhT Pharmacy Patient Advocate Specialist [facility] Patient Advocate Team Direct Number: [phone]  Fax: [phone]
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
## 2021-12-21 NOTE — Progress Notes (Signed)
Bilateral lower extremity venous duplex completed. Refer to "CV Proc" under chart review to view preliminary results.  12/21/2021 5:48 PM Eula Fried., MHA, RVT, RDCS, RDMS

## 2021-12-21 NOTE — Progress Notes (Signed)
Inpatient Rehabilitation  Patient information reviewed and entered into eRehab system by Soliyana Mcchristian Helia Haese, OTR/L.   Information including medical coding, functional ability and quality indicators will be reviewed and updated through discharge.    

## 2021-12-21 NOTE — Discharge Instructions (Addendum)
Inpatient Rehab Discharge Instructions  Elizabeth Walsh Discharge date and time: 01/02/2022  Activities/Precautions/ Functional Status: Activity: no lifting, driving, or strenuous exercise for until cleared by MD. See instructions below Diet: regular diet Wound Care: keep wound clean and dry Functional status:  ___ No restrictions     ___ Walk up steps independently ___ 24/7 supervision/assistance   ___ Walk up steps with assistance ___ Intermittent supervision/assistance  ___ Bathe/dress independently ___ Walk with walker     _x__ Bathe/dress with assistance ___ Walk Independently    ___ Shower independently _x__ Walk with assistance    _x__ Shower with assistance _x__ No alcohol     ___ Return to work/school ________  Special Instructions:   RLE: non-weight bearing - LLE: weight bearing for transfers, otherwise no more than 50% partial weight bearing for mobility - LUE: weigt bearing as tolerated Incisional and dressing care:  - RLE: splint left in place - LUE: change as needed Showering: Ok to shower, keep splint dry. LUE incision may get wet Orthopedic device(s):  - RLE: short leg splint - LLE: CAM boot - LUE: sling PRN for comfort. Radial nerve palsy splint    My questions have been answered and I understand these instructions. I will adhere to these goals and the provided educational materials after my discharge from the hospital.  Patient/Caregiver Signature _______________________________ Date __________  Clinician Signature _______________________________________ Date __________  Please bring this form and your medication list with you to all your follow-up doctor's appointments.      Information on my medicine - ELIQUIS (apixaban)  This medication education was reviewed with me or my healthcare representative as part of my discharge preparation.   Why was Eliquis prescribed for you? Eliquis was prescribed for you to reduce the risk of blood clots forming  after orthopedic surgery.    What do You need to know about Eliquis? Take your Eliquis TWICE DAILY - one tablet in the morning and one tablet in the evening with or without food.  It would be best to take the dose about the same time each day.  If you have difficulty swallowing the tablet whole please discuss with your pharmacist how to take the medication safely.  Take Eliquis exactly as prescribed by your doctor and DO NOT stop taking Eliquis without talking to the doctor who prescribed the medication.  Stopping without other medication to take the place of Eliquis may increase your risk of developing a clot.  After discharge, you should have regular check-up appointments with your healthcare provider that is prescribing your Eliquis.  What do you do if you miss a dose? If a dose of ELIQUIS is not taken at the scheduled time, take it as soon as possible on the same day and twice-daily administration should be resumed.  The dose should not be doubled to make up for a missed dose.  Do not take more than one tablet of ELIQUIS at the same time.  Important Safety Information A possible side effect of Eliquis is bleeding. You should call your healthcare provider right away if you experience any of the following: Bleeding from an injury or your nose that does not stop. Unusual colored urine (red or dark brown) or unusual colored stools (red or black). Unusual bruising for unknown reasons. A serious fall or if you hit your head (even if there is no bleeding).  Some medicines may interact with Eliquis and might increase your risk of bleeding or clotting while on Eliquis. To help  avoid this, consult your healthcare provider or pharmacist prior to using any new prescription or non-prescription medications, including herbals, vitamins, non-steroidal anti-inflammatory drugs (NSAIDs) and supplements.  This website has more information on Eliquis (apixaban):  http://[email or web address].com/eliquis/home
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
## 2021-12-21 NOTE — Progress Notes (Signed)
Physical Therapy Session Note  Patient Details  Name: Elizabeth Walsh MRN: [id number] Date of Birth: 1966-08-26  Today's Date: 12/21/2021 PT Individual Time: 1430-1525 PT Individual Time Calculation (min): 55 min   Short Term Goals: Week 1:  PT Short Term Goal 1 (Week 1): pt will transfer supine<>sitting EOB with mod A of 1 PT Short Term Goal 2 (Week 1): pt will transfer bed<>chair with LRAD and max A of 1 PT Short Term Goal 3 (Week 1): pt initate WC mobility  Skilled Therapeutic Interventions/Progress Updates:   Received pt sitting in WC with legs resting on bed and family present at bedside. Pt requesting to return to bed after sitting up since previous PT session this morning. Pt agreeable to PT treatment and did not state pain level, reporting more fatigue than anything. Session with emphasis on functional mobility/transfers, generalized strengthening, and improved activity tolerance. Tried AP transfer this afternoon and pt transferred WC<>bed with mod A +2 while adhering to BLE NWB precautions. 1st person assisting with weight shifting and scooting at pelvis with 2nd person managing LEs. Pt able to bear weight through LUE with less anxiety compared to this morning but continues to required increased time with transfers and needs rest breaks mid-way through. Long sitting<>semi-reclined with min A of 1. Pt scooted to [facility] with +2 assist and rolled L and R with +2 assist to position new chuck pad. Pt then performed the following exercises with emphasis on LE strength/ROM: -SLR 2x5 AROM on LLE and AAROM on RLE -SAQ 2x15 on LLE and 2x5 AAROM on RLE -hip abduction 1x8 and 1x10  AROM on LLE and AAROM on RLE -hip adduction pillow squeezes 2x10 Discussed optimal positioning for LUE with recommendation to place 2 pillows underneath elbow and maintain extension to stretch radial nerve. Concluded session with pt semi-reclined in bed, needs within reach, and bed alarm on with family present at beside.  Communicated with nursing recommendation to use bedpan for toileting.    Therapy Documentation Precautions:  Restrictions Weight Bearing Restrictions: Yes LUE Weight Bearing: Weight bearing as tolerated RLE Weight Bearing: Non weight bearing LLE Weight Bearing: Non weight bearing  Therapy/Group: Individual Therapy Martin Majestic PT, DPT   12/21/2021, 7:44 AM

## 2021-12-21 NOTE — Progress Notes (Signed)
[facility] Individual Statement of Services  Patient Name:  Elizabeth Walsh  Date:  12/21/2021  Welcome to the [facility].  Our goal is to provide you with an individualized program based on your diagnosis and situation, designed to meet your specific needs.  With this comprehensive rehabilitation program, you will be expected to participate in at least 3 hours of rehabilitation therapies Monday-Friday, with modified therapy programming on the weekends.  Your rehabilitation program will include the following services:  Physical Therapy (PT), Occupational Therapy (OT), Speech Therapy (ST), 24 hour per day rehabilitation nursing, Therapeutic Recreaction (TR), Neuropsychology, Care Coordinator, Rehabilitation Medicine, Nutrition Services, Pharmacy Services, and Other  Weekly team conferences will be held on Wednesdays to discuss your progress.  Your Inpatient Rehabilitation Care Coordinator will talk with you frequently to get your input and to update you on team discussions.  Team conferences with you and your family in attendance may also be held.  Expected length of stay:  14-16 Days  Overall anticipated outcome:  Supervision to Min A  Depending on your progress and recovery, your program may change. Your Inpatient Rehabilitation Care Coordinator will coordinate services and will keep you informed of any changes. Your Inpatient Rehabilitation Care Coordinator's name and contact numbers are listed  below.  The following services may also be recommended but are not provided by the [facility]:   [place] will be made to provide these services after discharge if needed.  Arrangements include referral to agencies that provide these services.  Your insurance has been verified to be:  [facility] Medicare  Your primary doctor is:  NO PCP  Pertinent information will be  shared with your doctor and your insurance company.  Inpatient Rehabilitation Care Coordinator:  Erlene Quan, [place] or [phone]  Information discussed with and copy given to patient by: Dyanne Iha, 12/21/2021, 2:44 PM

## 2021-12-22 DIAGNOSIS — T1490XA Injury, unspecified, initial encounter: Secondary | ICD-10-CM | POA: Diagnosis not present

## 2021-12-22 LAB — IRON AND TIBC
Iron: 73 ug/dL (ref 28–170)
Saturation Ratios: 25 % (ref 10.4–31.8)
TIBC: 293 ug/dL (ref 250–450)
UIBC: 220 ug/dL

## 2021-12-22 MED ORDER — APIXABAN 2.5 MG PO TABS
2.5000 mg | ORAL_TABLET | Freq: Two times a day (BID) | ORAL | Status: DC
  Administered 2021-12-22 – 2022-01-01 (×21): 2.5 mg via ORAL
  Filled 2021-12-22 (×22): qty 1

## 2021-12-22 MED ORDER — METHOCARBAMOL 500 MG PO TABS
1000.0000 mg | ORAL_TABLET | Freq: Four times a day (QID) | ORAL | Status: DC
  Administered 2021-12-23: 500 mg via ORAL
  Filled 2021-12-22 (×2): qty 2
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
## 2021-12-22 NOTE — Progress Notes (Signed)
Physical Therapy Session Note  Patient Details  Name: Elizabeth Walsh MRN: [id number] Date of Birth: 21-Jul-1966  Today's Date: 12/22/2021 PT Individual Time: 1305-1400 PT Individual Time Calculation (min): 55 min   Short Term Goals: Week 1:  PT Short Term Goal 1 (Week 1): pt will transfer supine<>sitting EOB with mod A of 1 PT Short Term Goal 2 (Week 1): pt will transfer bed<>chair with LRAD and max A of 1 PT Short Term Goal 3 (Week 1): pt initate WC mobility  Skilled Therapeutic Interventions/Progress Updates:   Pt received sitting in WC and agreeable to PT. Pt transported pt to rehab gym in [facility].   Seated therex LAQ and hip flexion 3x 10 with AAROM on the RLE. And AROM on the LLE. Cues for full ROM and decreased speed of eccentric movement as tolerated.   Per chart review, pt WBAT LLE. Slide Board transfers to and from [facility] with mod assist with +2 for safety and max cues for board placement, LE position, UE use and head/hips relationship. Pt noted to use the LUE spontaneously without increased pain.   WC mobility with RUE/LLE, since WB restrictions have been reduced to WBAT on the LLE. Pt required min assist to prevent veer to the L but demonstrated improving technique with increased distance.    Pt returned to room and performed slide board transfer to bed with max assist with +2 for safety and management of the RLE. Sit>supine completed with max assist due to poor motor planning secondary to pain,  and left supine in bed with call bell in reach and all needs met.       Therapy Documentation Precautions:  Precautions Precautions: Fall Precaution Comments: CAM boot for LLE Required Braces or Orthoses: Sling, Other Brace Other Brace: LUE sling for comfort, wrist cock-up splint during day, brace for night time Restrictions Weight Bearing Restrictions: Yes LUE Weight Bearing: Weight bearing as tolerated RLE Weight Bearing: Non weight bearing LLE Weight Bearing: Weight bearing as  tolerated  Vital Signs: Therapy Vitals Temp: 98.6 F (37 C) Pulse Rate: 77 Resp: 16 BP: (!) 105/54 Patient Position (if appropriate): Lying Oxygen Therapy SpO2: 100 % O2 Device: Room Air Pain:   3/10 LUE. Sore. UE repositioned.     Therapy/Group: Individual Therapy  Lorie Phenix 12/22/2021, 6:06 PM

## 2021-12-22 NOTE — Progress Notes (Signed)
Occupational Therapy Session Note  Patient Details  Name: Elizabeth Walsh MRN: [id number] Date of Birth: 15-Feb-1966  Today's Date: 12/22/2021 OT Individual Time: 0920-1028 & 1502-1530 OT Individual Time Calculation (min): 68 min & 28 min   Short Term Goals: Week 1:  OT Short Term Goal 1 (Week 1): Pt will donn LB clothing with mod A with LRAD OT Short Term Goal 2 (Week 1): Pt will report a pain level < 5/10 in LUE, consecutively for 2 sessions to promote functional use of non-dominant extremity OT Short Term Goal 3 (Week 1): Pt will complete slideboard transfer to bariatric BSC with no more than +2 min A  Skilled Therapeutic Interventions/Progress Updates:   Session 1 Skilled OT intervention completed with focus on functional endurance, toilet transfers, and LB clothing management. Pt received upright in bed, with friend and MD present. Pt agreeable to session. Pt reporting 2/10 pain, premedicated in RLE. PA (Setzer) in room to update pt and therapist that pt can now WBAT in LLE with CAM boot on. Pt required mod cues, with pt only able to follow one step commands throughout entire session due to pain/anxiety for all clothing management and transfers. Education provided to pt about meaning of WBAT status, efficiency of wearing pants for slideboard transfer, as well as purpose of using bariatric BSC. Pt required max A for LB clothing management with +2 assist needed for assisting pt rolling towards R side with second helper donning pants over hips. Educated pt on efficiency method with bending knees to allow gravity to assist with rolling vs having to pull/roll. Pt able to complete bed mobility with mod A of 1, with +1 for safety behind pt for postural control, and pt coming to EOB. Pt completed slideboard transfer from EOB, with total A needed for board placement, and Mod A +2 to w/c, with pt bearing weight in LLE and RLE propped on several pillows per pt comfort. Pt able to complete lateral leans with  min A, with constant cueing for positioning/comfort, for all clothing management at Max A and pt with continent void episode. Pt completed front pericare with CGA, with posterior lean. Pt able to SB transfer from [facility] to w/c with mod A +2, at same positioning as above. Pt left seated in w/c, with friend in room, RLE strapped on leg rest with gait belt per pt fear that leg would fall to ground. Pt stating her friend would take her out of the room, with this therapist informing them to notify nursing when exiting/returning room. Applied chair alarm to w/c and pt left seated with all immediate needs met at end of session.   Session 2 Skilled OT intervention completed with focus on AAROM and AROM of LUE. Pt received supine in bed, agreeable to session. Pt able to doff L wrist brace with supervision. Pt tolerated sitting upright with HOB elevated. Pt completed the following to prevent contracture, promote functional ROM needed in the LUE, and to alleviate pain:  AROM Elbow flexion/extension against gravity x10 AROM Composite flexion, with palm up/against gravity x10 AROM wrist extension, with gravity eliminated x10 AAROM shoulder flexion, against gravity x5  Pt required stabilization of L wrist throughout all motions to prevention wrist flexion/radial deviation, with support applied also to L elbow to help offload weight of arm due to pt's low pain tolerance. Pt with unrated pain during exercises, however improved with support given, rest breaks and breathing techniques. Pt left upright in bed, with brace donned with total A for  time, LUE propped on pillows, bed alarm on and all needs in reach at end of session.     Therapy Documentation Precautions:  Precautions Precautions: Fall Precaution Comments: CAM boot for LLE Required Braces or Orthoses: Sling, Other Brace Other Brace: LUE sling for comfort, wrist cock-up splint during day, brace for night time Restrictions Weight Bearing Restrictions:  Yes LUE Weight Bearing: Weight bearing as tolerated RLE Weight Bearing: Non weight bearing LLE Weight Bearing: Weight bearing as tolerated  Pain: 1st session  2/10 pain in RLE, premedicated, propped RLE on pillows for comfort during transfers   Therapy/Group: Individual Therapy  Manu Rubey E Christon Gallaway 12/22/2021, 7:42 AM
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
## 2021-12-22 NOTE — Progress Notes (Signed)
Physical Therapy Session Note  Patient Details  Name: Elizabeth Walsh MRN: [id number] Date of Birth: [birth year]  Today's Date: 12/22/2021 PT Individual Time: 1405-1430 PT Individual Time Calculation (min): 25 min   Short Term Goals: Week 1:  PT Short Term Goal 1 (Week 1): pt will transfer supine<>sitting EOB with mod A of 1 PT Short Term Goal 2 (Week 1): pt will transfer bed<>chair with LRAD and max A of 1 PT Short Term Goal 3 (Week 1): pt initate WC mobility  Skilled Therapeutic Interventions/Progress Updates:      Pt presents supine in bed. She reports 7/10 R thigh/femur pain. Provided rest breaks, repositioning, and emotional support for pain management. Ortho PA entering room, spending time educating on her fractures, healing time, and was clarifying WB restrictions. Asked PA to provide written order in chart to help clarify WB restrictions. PA reports LLE WBAT for transfers and 50% weight bearing for mobility.   Pt requesting to remain in bed due to long day of therapy and sitting up all day. Completed supine there-ex on LLE only due to R thigh/femur pain. -1x10 hip abduction -1x10 heel slides (AAROM) -1x10 quad sets -1x10 glut sets  Pt made comfortable in bed, pillow supporting RLE and LUE. All needs met, bed alarm on.   Therapy Documentation Precautions:  Precautions Precautions: Fall Precaution Comments: CAM boot for LLE Required Braces or Orthoses: Sling, Other Brace Other Brace: LUE sling for comfort, wrist cock-up splint during day, brace for night time Restrictions Weight Bearing Restrictions: Yes LUE Weight Bearing: Weight bearing as tolerated RLE Weight Bearing: Non weight bearing LLE Weight Bearing: Weight bearing as tolerated General:    Therapy/Group: Individual Therapy  Alger Simons 12/22/2021, 3:19 PM

## 2021-12-23 MED ORDER — AMITRIPTYLINE HCL 25 MG PO TABS
25.0000 mg | ORAL_TABLET | Freq: Every day | ORAL | Status: DC
  Administered 2021-12-24 – 2022-01-01 (×7): 25 mg via ORAL
  Filled 2021-12-23 (×10): qty 1

## 2021-12-23 MED ORDER — METHOCARBAMOL 750 MG PO TABS
750.0000 mg | ORAL_TABLET | Freq: Four times a day (QID) | ORAL | Status: DC
  Administered 2021-12-24 – 2022-01-02 (×23): 750 mg via ORAL
  Filled 2021-12-23 (×30): qty 1

## 2021-12-23 NOTE — Progress Notes (Signed)
Occupational Therapy Session Note  Patient Details  Name: Elizabeth Walsh MRN: [id number] Date of Birth: 09/02/1966  Today's Date: 12/23/2021 OT Individual Time: 1140-1220 OT Individual Time Calculation (min): 40 min    Short Term Goals: Week 1:  OT Short Term Goal 1 (Week 1): Pt will donn LB clothing with mod A with LRAD OT Short Term Goal 2 (Week 1): Pt will report a pain level < 5/10 in LUE, consecutively for 2 sessions to promote functional use of non-dominant extremity OT Short Term Goal 3 (Week 1): Pt will complete slideboard transfer to bariatric BSC with no more than +2 min A   Skilled Therapeutic Interventions/Progress Updates:    Pt sitting up in w/c, son and dtr present.  Pt c/o 10/10 pain in bilateral ankles.  Nurse made aware and entered to address medication management therefore missed 20 minutes of treatment.  When therapist returned, pt requesting back to bed.  Pt able to recall all precautions independently.  Sling donned to LUE with mod assist.  Pt transferred using sliding board with total assist +2 and third person present as guard assist for safety.  Pt completed sit to supine with total assist +2 as well.  Supported BLE and LUE with pillows for comfort.  Increased time required to complete all mobility to address pts high pain level.  Encouraged pt to used breathing techniques throughout to manage pain during mobility.  Call bell in reach, bed alarm on at end of session.    Therapy Documentation Precautions:  Precautions Precautions: Fall Precaution Comments: CAM boot for LLE Required Braces or Orthoses: Sling, Other Brace Other Brace: LUE sling for comfort, wrist cock-up splint during day, brace for night time Restrictions Weight Bearing Restrictions: Yes LUE Weight Bearing: Weight bearing as tolerated RLE Weight Bearing: Non weight bearing LLE Weight Bearing: Weight bearing as tolerated    Therapy/Group: Individual Therapy  Amie Critchley 12/23/2021, 12:41 PM
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
## 2021-12-23 NOTE — Progress Notes (Signed)
Physical Therapy Session Note  Patient Details  Name: Elizabeth Walsh MRN: [id number] Date of Birth: 04-26-1966  Today's Date: 12/23/2021 PT Individual Time: [id number] PT Individual Time Calculation (min): 39 min   Short Term Goals: Week 1:  PT Short Term Goal 1 (Week 1): pt will transfer supine<>sitting EOB with mod A of 1 PT Short Term Goal 2 (Week 1): pt will transfer bed<>chair with LRAD and max A of 1 PT Short Term Goal 3 (Week 1): pt initate WC mobility  Skilled Therapeutic Interventions/Progress Updates:    Chart reviewed and pt agreeable to therapy. Pt received in bed with RN present to place bedpan. Pt completed toileting on bedpan and required MaxA +2 for donning shorts. Pt reported no pain in leg at start, but t/o session did report rib pain and RLE pain with movement. Pt reported desire to rest RLE ankle and avoid a dependent position. Thus, session focused on transfer to EOB to initiate movement out of bed on tolerated. Pt transferred to EOB with MaxA for RLE management and bed rails to aid RUE. Pt then completed 3x10 LAQ and 90 R shoulder flexion. Pt maintained upright position for 15 mins before c/o rib pain. Pt then returned to bed with MaxA for RLE and increased time for pain control. At end of session, pt was semi-reclined in bed with alarm engaged, nurse call bell and all needs in reach.   Therapy Documentation Precautions:  Precautions Precautions: Fall Precaution Comments: CAM boot for LLE Required Braces or Orthoses: Sling, Other Brace Other Brace: LUE sling for comfort, wrist cock-up splint during day, brace for night time Restrictions Weight Bearing Restrictions: Yes LUE Weight Bearing: Weight bearing as tolerated RLE Weight Bearing: Non weight bearing LLE Weight Bearing: Weight bearing as tolerated    Therapy/Group: Individual Therapy  Marquette Saa, PT, DPT 12/23/2021, 3:52 PM

## 2021-12-23 NOTE — Progress Notes (Signed)
Physical Therapy Session Note  Patient Details  Name: Elizabeth Walsh MRN: [id number] Date of Birth: December 22, 1965  Today's Date: 12/23/2021 PT Missed Time: 44 Minutes Missed Time Reason: Pain  Pt received supine in bed watching TV with her family member present. Pt states she thought she was done with therapy for the day and had just gotten comfortable. Pt initially agreeable to bed level therapies stating she doesn't want to get to the EOB. Therapist initiates getting the bed ready and pt states "I'm sorry, I really don't want to do this." Pt states she cannot have any pain medication because her BP is running low and her pain is currently 6/10. Pt politely declines participation in therapy at this time. Nurse made aware. Missed 60 minutes of skilled physical therapy.   Tawana Scale , PT, DPT, NCS, CSRS 12/23/2021, 1:37 PM
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
## 2021-12-23 NOTE — Progress Notes (Signed)
Pt's blood pressure runs soft, no acute distress.On call MD was made aware, MD ordered parameters for Robaxin. Patient and family educated. Patient verbalized understanding.

## 2021-12-25 LAB — BASIC METABOLIC PANEL
Anion gap: 10 (ref 5–15)
BUN: 10 mg/dL (ref 6–20)
CO2: 23 mmol/L (ref 22–32)
Calcium: 8.4 mg/dL — ABNORMAL LOW (ref 8.9–10.3)
Chloride: 101 mmol/L (ref 98–111)
Creatinine, Ser: 0.61 mg/dL (ref 0.44–1.00)
GFR, Estimated: >60 mL/min (ref >60)
Glucose, Bld: 106 mg/dL — ABNORMAL HIGH (ref 70–99)
Potassium: 4.2 mmol/L (ref 3.5–5.1)
Sodium: 134 mmol/L — ABNORMAL LOW (ref 135–145)

## 2021-12-25 LAB — URINALYSIS, ROUTINE W REFLEX MICROSCOPIC
Bilirubin Urine: NEGATIVE
Glucose, UA: NEGATIVE mg/dL
Ketones, ur: NEGATIVE mg/dL
Leukocytes,Ua: NEGATIVE
Nitrite: NEGATIVE
Protein, ur: NEGATIVE mg/dL
Specific Gravity, Urine: 1.006 (ref 1.005–1.030)
pH: 5 (ref 5.0–8.0)

## 2021-12-25 LAB — CBC
HCT: 26.3 % — ABNORMAL LOW (ref 36.0–46.0)
Hemoglobin: 8.6 g/dL — ABNORMAL LOW (ref 12.0–15.0)
MCH: 31.4 pg (ref 26.0–34.0)
MCHC: 32.7 g/dL (ref 30.0–36.0)
MCV: 96 fL (ref 80.0–100.0)
Platelets: 446 10*3/uL — ABNORMAL HIGH (ref 150–400)
RBC: 2.74 MIL/uL — ABNORMAL LOW (ref 3.87–5.11)
RDW: 15.5 % (ref 11.5–15.5)
WBC: 11.2 10*3/uL — ABNORMAL HIGH (ref 4.0–10.5)
nRBC: 0.2 % (ref 0.0–0.2)

## 2021-12-25 MED ORDER — LISINOPRIL 10 MG PO TABS
10.0000 mg | ORAL_TABLET | Freq: Every day | ORAL | Status: DC
  Administered 2021-12-26 – 2021-12-27 (×2): 10 mg via ORAL
  Filled 2021-12-25 (×3): qty 1

## 2021-12-25 NOTE — Progress Notes (Signed)
Occupational Therapy Session Note  Patient Details  Name: Elizabeth Walsh MRN: [id number] Date of Birth: Mar 10, 1966  Today's Date: 12/25/2021 OT Individual Time: 1005-1100 OT Individual Time Calculation (min): 55 min    Short Term Goals: Week 1:  OT Short Term Goal 1 (Week 1): Pt will donn LB clothing with mod A with LRAD OT Short Term Goal 2 (Week 1): Pt will report a pain level < 5/10 in LUE, consecutively for 2 sessions to promote functional use of non-dominant extremity OT Short Term Goal 3 (Week 1): Pt will complete slideboard transfer to bariatric BSC with no more than +2 min A  Skilled Therapeutic Interventions/Progress Updates:  Skilled OT intervention completed with focus on ADL retraining, functional transfers, activity tolerance. Pt received supine in bed, agreeable to session. Pt was more motivated this session, actively completing self-care with more effort this session, however required increased time during all transitions. Therapist donned CAM boot with total A prior to LB clothing management to prevent weightbearing without boot on, as pt with decreased precaution adherence. Pt able to donn L wrist brace with set up A. Pt's pants found to be halfway down, with pt able to roll L<>R with increased time and use of bed rails for pulling and 1 person at mod A for rolling trunk when towards L side and 2nd person donning over hips. Note- small amount of blood on bed pad, with no visible wound on bottom and pt stating she might've wiped too hard. Pt able to complete bed mobility for semi-supine to sitting EOB with min A only needed for guiding RLE onto pillows on floor for pt comfort. Pt total A for board placement and pt tolerating weight on LUE with 1 person at [facility] for trunk control, and then slide board transfer with min A +2 with 2nd person mostly for safety. Pt used LLE as tolerated but below 50% weight bearing to assist in pushing while on slideboard. Once in w/c, pt completed UB bathing  and dressing with pt educated on hemi-technique. Pt required mod A for washing R arm due to pain on L side, however with max encouragement, was able to use L hand to donn deodorant under R arm with therapist providing light support at elbow for comfort. Pt donned new shirt with min A. Pt emotional at end of session stating she feels trapped and depressed after staffing told her she couldn't leave the room as her grounds pass was for only one occurrence. Discussed with care team, and plan to provide emotional support/education to pt on improving her transfers with less assist before being cleared to leave more often despite getting out of room for therapies. Pt left seated in w/c with chair alarm on, daughter in room and all needs in reach at end of session.    Therapy Documentation Precautions:  Precautions Precautions: Fall Precaution Comments: CAM boot for LLE Required Braces or Orthoses: Sling, Other Brace Other Brace: LUE sling for comfort, wrist cock-up splint during day, brace for night time Restrictions Weight Bearing Restrictions: Yes LUE Weight Bearing: Weight bearing as tolerated RLE Weight Bearing: Non weight bearing LLE Weight Bearing: Weight bearing as tolerated  Pain: Unrated pain in bilateral ankles, denied intervention, premedicated   Therapy/Group: Individual Therapy  Pharoah Goggins E Mele Sylvester 12/25/2021, 7:43 AM
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
## 2021-12-25 NOTE — Progress Notes (Signed)
Occupational Therapy Session Note  Patient Details  Name: SHENAY TORTI MRN: [id number] Date of Birth: [birth year]  Today's Date: 12/25/2021 OT Individual Time: 9381-0175 OT Individual Time Calculation (min): 54 min    Short Term Goals: Week 1:  OT Short Term Goal 1 (Week 1): Pt will donn LB clothing with mod A with LRAD OT Short Term Goal 2 (Week 1): Pt will report a pain level < 5/10 in LUE, consecutively for 2 sessions to promote functional use of non-dominant extremity OT Short Term Goal 3 (Week 1): Pt will complete slideboard transfer to bariatric BSC with no more than +2 min A  Skilled Therapeutic Interventions/Progress Updates:    Pt in wheelchair with her daughter present in the room.  Worked on LUE AAROM exercises during session.  Therapist removed radial nerve splint from the left hand with work on one set of 10 reps wrist and digit extension with hand in neutral gravity eliminated position.  She was able to exhibit AROM at the PIP and DIPs but not at the wrist or MPs at this time.  Next she completed 2 sets of 10 reps for elbow flexion and extension in the LUE with only min assist.  She also performed 1 set of 10 reps for supination and pronation without assist from therapist.  Ms. Crenshaw completed 2 sets of 10 reps for shoulder flexion AAROM as well as shoulder abduction AAROM.  She was able to tolerate AAROM from 0-130 degrees for both movements with no report of pain in the arm, but some pain in the rib cage on her left side.  Finished session with completion of 1 set of 10 reps AROM shoulder internal and external rotation.  Encouraged pt to try and use the LUE throughout the day for picking up her cup and drinking from it as she does not have any weightbearing restrictions at this time.  She voiced understanding of this and will continue to incorporate it into her day.  She was left sitting in the wheelchair at end of session with call button in reach and her daughter present.    Therapy  Documentation Precautions:  Precautions Precautions: Fall Precaution Comments: CAM boot for LLE Required Braces or Orthoses: Sling, Other Brace Other Brace: LUE sling for comfort, wrist cock-up splint during day, brace for night time Restrictions Weight Bearing Restrictions: Yes LUE Weight Bearing: Weight bearing as tolerated RLE Weight Bearing: Non weight bearing LLE Weight Bearing: Partial weight bearing LLE Partial Weight Bearing Percentage or Pounds: 50%  Pain: Pain Assessment Pain Scale: Faces Faces Pain Scale: Hurts a little bit Pain Type: Acute pain Pain Location: Rib cage Pain Orientation: Left Pain Descriptors / Indicators: Discomfort Pain Onset: With Activity Pain Intervention(s): Repositioned;Emotional support ADL: See Care Tool Section for some details of mobility and selfcare   Therapy/Group: Individual Therapy  Kahlani Graber OTR/L 12/25/2021, 4:31 PM

## 2021-12-25 NOTE — Progress Notes (Signed)
Physical Therapy Session Note  Patient Details  Name: Elizabeth Walsh MRN: [id number] Date of Birth: February 17, 1966  Today's Date: 12/25/2021 PT Individual Time: 7482-7078 PT Individual Time Calculation (min): 70 min   Short Term Goals: Week 1:  PT Short Term Goal 1 (Week 1): pt will transfer supine<>sitting EOB with mod A of 1 PT Short Term Goal 2 (Week 1): pt will transfer bed<>chair with LRAD and max A of 1 PT Short Term Goal 3 (Week 1): pt initate WC mobility  Skilled Therapeutic Interventions/Progress Updates:   Received pt sitting on bedside commode with daughter present at bedside. Pt agreeable to PT treatment and reported pain 2/10 in chest. Session with emphasis on functional mobility/transfers, toileting, generalized strengthening and endurance, and improved activity tolerance. Pt performed 1/2 peri-care with supervision but required total A for remainder of peri-care via anterior/lateral leans. Pt leaned L/R to pull gown under thighs and transferred bedside commode<>WC via slideboard (with pillowcase over top) with min A +2 with 2nd person managing RLE (pt now able to WBAT through LLE for transfers only). Pt transported to/from room in [facility] dependently for time management purposes and performed BUE strengthening on UBE at level 1 for 2 minutes forward and 2 minutes backwards - pt pleasantly surprised with how much she was able to use LUE. Pt then performed the following exercises with supervision and verbal cues for technique: -hip flexion 2x12 on LLE  -LAQ 2x12 AROM on LLE and x5 AAROM on RLE Pt asking about grounds pass to go outside. Dicussed with RN and MD and agreed that pt is able to go outside with family after therapy sessions once she is already in [facility], due to significantly increased amount of time it takes pt to get into [facility] - updated safety plan. Concluded session with pt sitting in [facility] with needs within reach and family present at bedside.   Therapy Documentation Precautions:   Precautions Precautions: Fall Precaution Comments: CAM boot for LLE Required Braces or Orthoses: Sling, Other Brace Other Brace: LUE sling for comfort, wrist cock-up splint during day, brace for night time Restrictions Weight Bearing Restrictions: Yes LUE Weight Bearing: Weight bearing as tolerated RLE Weight Bearing: Non weight bearing LLE Weight Bearing: Weight bearing as tolerated  Therapy/Group: Individual Therapy Martin Majestic PT, DPT   12/25/2021, 7:36 AM

## 2021-12-26 ENCOUNTER — Other Ambulatory Visit (HOSPITAL_COMMUNITY): Payer: Self-pay

## 2021-12-26 LAB — CBC WITH DIFFERENTIAL/PLATELET
Abs Immature Granulocytes: 0.13 10*3/uL — ABNORMAL HIGH (ref 0.00–0.07)
Basophils Absolute: 0.1 10*3/uL (ref 0.0–0.1)
Basophils Relative: 1 %
Eosinophils Absolute: 0.3 10*3/uL (ref 0.0–0.5)
Eosinophils Relative: 3 %
HCT: 26.3 % — ABNORMAL LOW (ref 36.0–46.0)
Hemoglobin: 8.6 g/dL — ABNORMAL LOW (ref 12.0–15.0)
Immature Granulocytes: 1 %
Lymphocytes Relative: 17 %
Lymphs Abs: 1.7 10*3/uL (ref 0.7–4.0)
MCH: 31.4 pg (ref 26.0–34.0)
MCHC: 32.7 g/dL (ref 30.0–36.0)
MCV: 96 fL (ref 80.0–100.0)
Monocytes Absolute: 0.6 10*3/uL (ref 0.1–1.0)
Monocytes Relative: 6 %
Neutro Abs: 7.3 10*3/uL (ref 1.7–7.7)
Neutrophils Relative %: 72 %
Platelets: 421 10*3/uL — ABNORMAL HIGH (ref 150–400)
RBC: 2.74 MIL/uL — ABNORMAL LOW (ref 3.87–5.11)
RDW: 15.3 % (ref 11.5–15.5)
WBC: 10.1 10*3/uL (ref 4.0–10.5)
nRBC: 0.3 % — ABNORMAL HIGH (ref 0.0–0.2)

## 2021-12-26 NOTE — TOC Benefit Eligibility Note (Signed)
Patient Product/process development scientist completed.    The patient is currently admitted and upon discharge could be taking Eliquis 2.5 mg.  The current 30 day co-pay is, $60.00.   The patient is insured through [employer]     Elizabeth Walsh, CPhT Pharmacy Patient Advocate Specialist [facility] Patient Advocate Team Direct Number: [phone]  Fax: [phone]
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
## 2021-12-26 NOTE — Progress Notes (Signed)
Physical Therapy Session Note  Patient Details  Name: Elizabeth Walsh MRN: [id number] Date of Birth: Jan 22, 1966  Today's Date: 12/26/2021 PT Individual Time: 0802-0855 PT Individual Time Calculation (min): 53 min   Short Term Goals: Week 1:  PT Short Term Goal 1 (Week 1): pt will transfer supine<>sitting EOB with mod A of 1 PT Short Term Goal 2 (Week 1): pt will transfer bed<>chair with LRAD and max A of 1 PT Short Term Goal 3 (Week 1): pt initate WC mobility  Skilled Therapeutic Interventions/Progress Updates:     Pt supine in bed to start session. RN at bedside administering morning medications. Pt also had just received her breakfast tray and requested to eat a few bites prior to PT tx. BP 109/82 resting in bed. Denies resting pain or pain during our session. Donned CAM boot to RLE with totalA. Donned athletic shorts at bed level with totalA for time. Able to roll in bed with minA for RLE management only. Supine> long sitting independently and then required minA for long sitting to EOB for RLE support only.   Pt able to complete UB dressing without assist with min verbal cueing for threading her LUE in first. Able to laterally scoot along EOB with CGA. Completed SB transfer with assist for board placement only from EOB to w/c, CGA for safety. Able to complete SB transfer in multiple (x5) small scoots and reposition herself in w/c without assist.   Pt then instructed in w/c propulsion. She propelled herself ~272f + ~1071fwith supervision while using her RUE/LLE to propel. Pt asking if she could use her LUE to assist with propelling and she was able to take a few strokes but pt deferred due to fear of pain and catching her hand in spokes of rim. She would benefit from practicing in ADL apartment suite or tight spaces to simulate home environment. She would also benefit from further ed on w/c management (brakes, arm rests, and leg rests).   Pt concluded session seated in w/c with family members  at the bedside (sister and daughter). All needs met and informed of upcoming therapy schedule.    Therapy Documentation Precautions:  Precautions Precautions: Fall Precaution Comments: CAM boot for LLE Required Braces or Orthoses: Sling, Other Brace Other Brace: LUE sling for comfort, wrist cock-up splint during day, brace for night time Restrictions Weight Bearing Restrictions: Yes LUE Weight Bearing: Weight bearing as tolerated RLE Weight Bearing: Non weight bearing LLE Weight Bearing: Partial weight bearing LLE Partial Weight Bearing Percentage or Pounds:  (50%) General:    Therapy/Group: Individual Therapy  ChAlger Simons/05/2022, 7:43 AM

## 2021-12-26 NOTE — Progress Notes (Signed)
Occupational Therapy Session Note  Patient Details  Name: Elizabeth Walsh MRN: [id number] Date of Birth: 07/21/1966  Today's Date: 12/26/2021 OT Individual Time: 1030-1100 & 1405-1430 OT Individual Time Calculation (min): 30 min  & 25 min Today's Date: 12/26/2021 OT Co-Treatment Time: 1015 (co-treat with Tobi Bastos, PT)-1030 OT Co-Treatment Time Calculation (min): 15 min    Short Term Goals: Week 1:  OT Short Term Goal 1 (Week 1): Pt will donn LB clothing with mod A with LRAD OT Short Term Goal 2 (Week 1): Pt will report a pain level < 5/10 in LUE, consecutively for 2 sessions to promote functional use of non-dominant extremity OT Short Term Goal 3 (Week 1): Pt will complete slideboard transfer to bariatric BSC with no more than +2 min A  Skilled Therapeutic Interventions/Progress Updates:  Session 1 Skilled OT intervention completed with focus on toilet transfers to bariatric drop arm BSC, LB clothing management during toileting and AE education. Pt received in ortho gym with PT, for initial co-treat, with education regarding equipment needs for toilet transfers, and method of slideboarding to wide drop arm BSC being safest toilet transfer at this time. Discussed options for purchasing DME, and located one online to show pt price/picture. Transported pt with total A in w/c, back to room. Pt place slideboard with CGA, however with poor placement and needed cues for safety. Pt completed slideboard transfer from w/c to [facility] with CGA, as pt had difficulty slightly going up hill onto [facility] and not lifting hips enough for slide technique. Education provided on how to increase efficiency with this technique, with using theraband or dycem to increase friction of transferring surface due to the nature of the two hard surfaces. Pt requesting to toilet while on commode with education provided on lateral lean techniques for doffing/donning pants and for pericare with pt able to complete pericare with supervision, and LB  clothing with mod A with pt needing cues to WB on L leg for stability however with improved initiation of WB through L elbow this session. Education provided on AE such as reacher for threading pants over both feet, and anticipate practicing this in future session. Pt transferred back to w/c with min A needed for board placement, then slid with CGA to w/c with better clearing of hips and sliding this trial. Pt left seated in w/c, with BLE leg rests on and R leg secured with gait belt per pt request, with chair alarm on and all needs in reach at end of session.  Session 2 Skilled OT intervention completed with focus on AROM and AAROM of LUE to promote functional use of non-dominant side needed for self-care tasks. Pt received seated in w/c, agreeable to session. Transported pt with total A in w/c <> therapy gym. Pt completed digit and wrist flexion/extension x10 in gravity eliminated position, with light facilitation provided for extension as pt demonstrated compensatory wrist flexion to promote digit extension. Pt donned L wrist brace with mod I, then completed x10 AROM of elbow flexion with stabilization provided at pt's elbow however pt with improved ability to move forearm on her own this session. Pt then completed x9 AAROM shoulder flexion with elbow bent to 90 degrees to offload weight of arm, with shoulder joint able to achieve at least 90 degrees shoulder flexion with stabilization/assist provided at elbow, then pt able to complete x1 attempt of AROM at same position. Educated pt on nerve healing, and innervation of muscles and how that affects movement. Pt left seated in w/c,  with chair alarm on and all needs in reach at end of session.    Therapy Documentation Precautions:  Precautions Precautions: Fall Precaution Comments: CAM boot for LLE Required Braces or Orthoses: Sling, Other Brace Other Brace: LUE sling for comfort, wrist cock-up splint during day, brace for night  time Restrictions Weight Bearing Restrictions: Yes LUE Weight Bearing: Weight bearing as tolerated RLE Weight Bearing: Non weight bearing LLE Weight Bearing: Partial weight bearing LLE Partial Weight Bearing Percentage or Pounds:  (50%)  Pain: No c/o pain  Therapy/Group: Individual Therapy  Naylene Foell E Azaylia Fong 12/26/2021, 7:41 AM

## 2021-12-26 NOTE — Progress Notes (Addendum)
Physical Therapy Session Note  Patient Details  Name: Elizabeth Walsh MRN: [id number] Date of Birth: 05-May-1966  Today's Date: 12/26/2021 PT Individual Time: 0930-1000 PT Individual Time Calculation (min): 30 min   Today's Date: 12/26/2021 PT Co-Treatment Time: 1000-1015 PT Co-Treatment Time Calculation (min): 15 min  Short Term Goals: Week 1:  PT Short Term Goal 1 (Week 1): pt will transfer supine<>sitting EOB with mod A of 1 PT Short Term Goal 2 (Week 1): pt will transfer bed<>chair with LRAD and max A of 1 PT Short Term Goal 3 (Week 1): pt initate WC mobility  Skilled Therapeutic Interventions/Progress Updates:   Received pt sitting in WC, pt agreeable to PT treatment, and denied any pain during session. Session with emphasis on functional mobility/transfers, simulated car transfers, and improved activity tolerance. Pt transported to/from room in [facility] dependently for time management purposes. Pt performed simulated car transfer with slideboard and CGA. Pt able to place slideboard without assist from therapist and get BLEs in/out of car without assist. Educated pt on WC parts management including brake management and maneuvering armrests - pt still requires assist to don/doff legrests. Transitioned to working on independence setting up transfers. Pt able to set up transfer to mat with min cues (reminder to lock brakes first). Pt transferred to/from [facility] via slideboard with close supervision with pt able to place and remove slideboard. Pt with questions regarding bathroom set up and OT arrived for co-treatment for remainder of session with emphasis on toilet transfers to drop arm bedside commode. Pt left in care of OT.   Therapy Documentation Precautions:  Precautions Precautions: Fall Precaution Comments: CAM boot for LLE Required Braces or Orthoses: Sling, Other Brace Other Brace: LUE sling for comfort, wrist cock-up splint during day, brace for night time Restrictions Weight Bearing  Restrictions: Yes LUE Weight Bearing: Weight bearing as tolerated RLE Weight Bearing: Non weight bearing LLE Weight Bearing: Partial weight bearing LLE Partial Weight Bearing Percentage or Pounds:  (50%)  Therapy/Group: Individual Therapy and Co-Treatment Martin Majestic PT, DPT   12/26/2021, 7:27 AM

## 2021-12-26 NOTE — Progress Notes (Signed)
Physical Therapy Session Note  Patient Details  Name: Elizabeth Walsh MRN: [id number] Date of Birth: May 22, 1966  Today's Date: 12/26/2021 PT Individual Time: 2876-8115 PT Individual Time Calculation (min): 47 min   Short Term Goals: Week 1:  PT Short Term Goal 1 (Week 1): pt will transfer supine<>sitting EOB with mod A of 1 PT Short Term Goal 2 (Week 1): pt will transfer bed<>chair with LRAD and max A of 1 PT Short Term Goal 3 (Week 1): pt initate WC mobility  Skilled Therapeutic Interventions/Progress Updates:    Pt received sitting in w/c with her family present and with minimal encouragement pt agreeable to therapy session. Pt already wearing L LE CAM boot and L UE custom wrist/hand splint. Pt reports significant difficulty performing wheelchair navigation in smaller spaces earlier today. Pt doffed her custom wrist/hand splint and donned wrist cock-up splint.   Focused on wheelchair navigation around bed and in/out of her hospital room for more home-like set-up and spacing. Pt reports her plan is to D/C to her son's home that is "supposed to be handicap accessible." Pt with difficulty achieving sufficient L hand grasp on wheel rim to perform tighter wheelchair turns therefore therapist recommended using B UEs and L LE to improve navigation and ability to turn wheelchair - pt completed ~60ft of room level wheelchair mobility with mod verbal cuing for technique and motor planning/problem solving how to navigate tight turns.   Pt set-up wheelchair for L lateral scoot transfer from w/c to EOB with mod/max cuing for proper alignment of wheelchair at the bed and R LE management (total assist to doff w/c leg rest and min assist to manage armrest) . Pt able to place transfer board with cuing and visual demonstration - more difficulty placing board for transfers this direction due to L hand weakness. Completed L lateral scoots with CGA/close supervision for safety and therapist stabilizing wheelchair.    Sitting EOB performed R LE knee extension/long arc quads but only through ~5-10degrees of ROM to terminal knee extension x10 reps - therapist educated pt on importance of performing R knee flexion and extension AROM for edema and pain management while sitting upright in w/c during the day.  Sit>supine, HOB flat but using bedrails as needed, with supervision and increased time and effort to bring B LEs (R more challenging than L LE) onto bed with pt using UEs to assist with this.   At end of session, pt left in supported long sitting in bed with needs in reach, bed alarm on, and family and nurse present.  Therapy Documentation Precautions:  Precautions Precautions: Fall Precaution Comments: CAM boot for LLE Required Braces or Orthoses: Sling, Other Brace Other Brace: LUE sling for comfort, wrist cock-up splint during day, brace for night time Restrictions Weight Bearing Restrictions: Yes LUE Weight Bearing: Weight bearing as tolerated RLE Weight Bearing: Non weight bearing LLE Weight Bearing: Partial weight bearing LLE Partial Weight Bearing Percentage or Pounds:  (50%)   Pain:  Pt reports primarily rib pain with some R LE pian, unrated, but therapist provides rest breaks, emotional support, repositioning, and distraction for pain management.   Therapy/Group: Individual Therapy  Ginny Forth , PT, DPT, NCS, CSRS  12/26/2021, 3:26 PM

## 2021-12-27 LAB — URINE CULTURE

## 2021-12-27 NOTE — Progress Notes (Signed)
Physical Therapy Session Note  Patient Details  Name: Elizabeth Walsh MRN: [id number] Date of Birth: [birth year]  Today's Date: 12/27/2021 PT Individual Time: 1330-1410 PT Individual Time Calculation (min): 40 min   Short Term Goals: Week 1:  PT Short Term Goal 1 (Week 1): pt will transfer supine<>sitting EOB with mod A of 1 PT Short Term Goal 2 (Week 1): pt will transfer bed<>chair with LRAD and max A of 1 PT Short Term Goal 3 (Week 1): pt initate WC mobility  Skilled Therapeutic Interventions/Progress Updates:   Received pt sitting in [facility] with family present at bedside. Pt agreeable to PT treatment and denied any pain during session. LLE CAM boot donned throughout session. Session with emphasis on functional mobility, generalized strengthening and ROM, and WC mobility. Pt with numerous questions about when pins in leg will be removed - reached out to MD. Discussed follow up therapy as pt confused about current recommendation. Clarified recommendation to receive OPPT once WB restrictions have been lifted. Pt then performed WC mobility 258ft with supervision using BUE and LLE with emphasis on navigating tight spaces and turns and endurance - pt required multiple rest break. Returned to room and performed LAQ 2x10 on RLE - decreased ROM and hip flexion 2x8 on RLE - decreased ROM. Concluded session with pt sitting in [facility] with all needs within reach and family present at bedside.   Therapy Documentation Precautions:  Precautions Precautions: Fall Precaution Comments: CAM boot for LLE Required Braces or Orthoses: Sling, Other Brace Other Brace: LUE sling for comfort, wrist cock-up splint during day, brace for night time Restrictions Weight Bearing Restrictions: Yes LUE Weight Bearing: Weight bearing as tolerated RLE Weight Bearing: Non weight bearing LLE Weight Bearing: Partial weight bearing LLE Partial Weight Bearing Percentage or Pounds:  (50%)  Therapy/Group: Individual Therapy Alfonse Alpers PT, DPT   12/27/2021, 7:38 AM

## 2021-12-27 NOTE — Progress Notes (Signed)
Physical Therapy Session Note  Patient Details  Name: Elizabeth Walsh MRN: [id number] Date of Birth: 11/16/1966  Today's Date: 12/27/2021 PT Individual Time: 1437-1500 and 0762-2633 PT Individual Time Calculation (min): 23 min and 68 min  Short Term Goals: Week 1:  PT Short Term Goal 1 (Week 1): pt will transfer supine<>sitting EOB with mod A of 1 PT Short Term Goal 2 (Week 1): pt will transfer bed<>chair with LRAD and max A of 1 PT Short Term Goal 3 (Week 1): pt initate WC mobility  Skilled Therapeutic Interventions/Progress Updates:    Session 1: Pt received sitting in w/c with family present and pt agreeable to therapy session. Pt already wearing L LE CAM boot and L UE wrist cock-up splint. Pt reports need to use bathroom and requests to try and go into the bathroom. Pt performs L LE and and B UE w/c propulsion in room having to back ~61ft to get into the bathroom with assist at end to position w/c correctly next to [facility] over toilet due to small bathroom space. Requires total assist to manage w/c leg rests and max assist to manage L armrest due to L hand impairments. L lateral scoot w/c>bari-BSC using transfer board with total assist to place board on this L side in this small space and then CGA for steadying while scooting - therapist stabilizing wheelchair for patient safety - cuing for sequencing and ensuring safe positioning during transfer. Lateral leans on BSC to manage LB clothing with max/total assist - multimodal cuing to achieve lateral leans and pelvic lift. Continent of bladder and performed anterior peri-care without assist. R lateral scoot bari-BSC>w/c using transfer board with max cuing to ensure safe board placement (but pt does not require assistance with placing board on this side) and CGA for safety while scooting and therapist stabilizing wheelchair for pt safety. Pt performed L LE and B UE w/c propulsion to sink with supervision - hand hygiene at sink with assist to reach far/high  items like paper towels and faucet handles. At end of session, pt left seated in w/c with B LEs supported on elevating leg rests, family present, and needs in reach.    Session 2: Pt received sitting in w/c with her daughter present and pt agreeable to therapy session with plan to focus on providing pt with LE exercises to be performed between therapy sessions and upon D/C (may need updating/modifying prior to D/C pending on pt's progress). Pt wearing L LE CAM boot. Pt able to set-up wheelchair for lateral scoot transfer with min verbal cuing and total assist for R w/c leg rest management. L lateral scoot transfer w/c>EOB using transfer board with pt able to place board without assist in this environment (compared to earlier in bathroom with L directional transfer) but does continue to require mod/max verbal cuing to ensure board is placed correctly - completed scoot with supervision and therapist stabilizing w/c for safety. Pt doffed L LE CAM boot min assist. Sit>supine, HOB slightly elevated and using bedrail with supervision and increased time/effort to bring R LE up onto bed with pt using her UEs to assist with this.   Performed the supine exercises provided on her HEP as written below. Therapist educated pt's daughter on how to properly assist pt with active-assisted exercises on R LE - pt's daughter demonstrated excellent understanding during session. Therapist cuing pt throughout for proper form/technique and educating pt on importance of maintaining ROM in R hip and knee as it is currently limited due  to edema and likely some muscle length shortening as well as importance of performing gentle L ankle DF/PF AROM. Pt would benefit from additional clarification from MD on her ability to perform any resisted exercises to assist with preventing muscle atrophy during WBing restrictions.  Access Code: [email or web address] URL: [email or web address] Date: 12/27/2021 Prepared by: Casimiro Needle  Exercises Seated Long Arc Quad - 1 x daily - 7 x weekly - 2 sets - 10 reps Seated Heel Slide - 1 x daily - 7 x weekly - 2 sets - 10 reps Supine Quad Set - 1 x daily - 7 x weekly - 2 sets - 20 reps - 5 seconds hold Supine Gluteal Sets - 1 x daily - 7 x weekly - 2 sets - 20 reps - 5 seconds hold Supine Heel Slide - 1 x daily - 7 x weekly - 2 sets - 10 reps Supine Hip Abduction - 1 x daily - 7 x weekly - 2 sets - 10 reps Supine Active Straight Leg Raise - 1 x daily - 7 x weekly - 2 sets - 10 reps Supine Ankle Pumps - 1 x daily - 7 x weekly - 3 sets - 10 reps  Provided pt with HEP printout. Discussed D/C planning with education on family assessing bed height as well as taking pictures of the bathroom to assist primary therapy team simulating home environment set-up for training purposes this weekend. Therapist provided pt with ice on both knees, L ankle, and L shoulder. At end of session, pt left supine in bed with needs in reach, bed alarm on, and family present.    Therapy Documentation Precautions:  Precautions Precautions: Fall Precaution Comments: CAM boot for LLE Required Braces or Orthoses: Sling, Other Brace Other Brace: LUE sling for comfort, wrist cock-up splint during day, brace for night time Restrictions Weight Bearing Restrictions: Yes LUE Weight Bearing: Weight bearing as tolerated RLE Weight Bearing: Non weight bearing LLE Weight Bearing: Partial weight bearing LLE Partial Weight Bearing Percentage or Pounds:  (50%)   Pain:  Session 1: Does not complain of pain during session but will grimace with certain movements of R LE primarily.   Session 2: Pt with unrated R LE pain during exercises as to be expected, but tolerable to continue with session - therapist provided ice for pain management at end of session and pt reporting she will call nursing if needing medication.   Therapy/Group: Individual Therapy  Ginny Forth , PT, DPT, NCS, CSRS  12/27/2021, 12:28  PM

## 2021-12-27 NOTE — Progress Notes (Signed)
Patient ID: Elizabeth Walsh, female   DOB: 1966-08-01, 56 y.o.   MRN: [id number] Team Conference Report to Patient/Family  Team Conference discussion was reviewed with the patient and caregiver, including goals, any changes in plan of care and target discharge date.  Patient and caregiver express understanding and are in agreement.  The patient has a target discharge date of 01/02/22.  SW met with patient and family member, provided team conference updates. Made attempt to schedule family education, patient son will update SW of preferred day of educations. SW updated patient will require OP once WB restrictions are lifted.  Dyanne Iha 12/27/2021, 1:59 PM

## 2021-12-27 NOTE — Progress Notes (Signed)
Physical Therapy Session Note  Patient Details  Name: Elizabeth Walsh MRN: [id number] Date of Birth: [birth year]  Today's Date: 12/27/2021 PT Individual Time: 0830-0930 PT Individual Time Calculation (min): 60 min   Short Term Goals: Week 1:  PT Short Term Goal 1 (Week 1): pt will transfer supine<>sitting EOB with mod A of 1 PT Short Term Goal 2 (Week 1): pt will transfer bed<>chair with LRAD and max A of 1 PT Short Term Goal 3 (Week 1): pt initate WC mobility  Skilled Therapeutic Interventions/Progress Updates:    Patient received sitting edge of bed with sister present, agreeable to PT. She denies pain. Patient requesting assist to finish donning shorts. Laying back down with CGA to manage R LE. Up to MaxA to don shorts as bed level with verbal cues for rolling and sequencing. Supervision to come sit edge of bed again to don gown. Patient requesting to don pre-fab L wrist brace instead of custom made. MD in /out for AM assessment.  Patient prepping to complete slideboard transfer to wc, but then requested to use female urinal. MaxA to doff shorts while seated leaning side to side. Patient unable to place urinal in time and correctly- requiring change of shorts. MaxA to don new shorts seated with lateral leaning. Slideboard transfer to wc with CGA. PT transporting patient in wc to therapy gym for time management and energy conservation. PT performing ice massage to L ankle for swelling and ecchymosis management. Returning to room in wc, family  at bedside, needs within reach.    Therapy Documentation Precautions:  Precautions Precautions: Fall Precaution Comments: CAM boot for LLE Required Braces or Orthoses: Sling, Other Brace Other Brace: LUE sling for comfort, wrist cock-up splint during day, brace for night time Restrictions Weight Bearing Restrictions: Yes LUE Weight Bearing: Weight bearing as tolerated RLE Weight Bearing: Non weight bearing LLE Weight Bearing: Partial weight  bearing LLE Partial Weight Bearing Percentage or Pounds:  (50%)    Therapy/Group: Individual Therapy  Elizebeth Koller, PT, DPT, CBIS  12/27/2021, 7:43 AM

## 2021-12-27 NOTE — Patient Care Conference (Signed)
Inpatient RehabilitationTeam Conference and Plan of Care Update Date: 12/27/2021   Time: 11:45 AM    Patient Name: Elizabeth Walsh      Medical Record Number: [id number]  Date of Birth: 30-Mar-1966 Sex: Female         Room/Bed: 5C16C/5C16C-01 Payor Info: Payor: Advertising copywriter / Plan: Advertising copywriter OTHER / Product Type: *No Product type* /    Admit Date/Time:  12/20/2021  1:32 PM  Primary Diagnosis:  Trauma  Hospital Problems: Principal Problem:   Trauma    Expected Discharge Date: Expected Discharge Date: 01/02/22  Team Members Present: Physician leading conference: Dr. Sula Soda Social Worker Present: Lavera Guise, BSW Nurse Present: Kennyth Arnold, RN PT Present: Raechel Chute, PT OT Present: Other (comment) [facility] Laurence Spates, OT) PPS Coordinator present : Fae Pippin, SLP     Current Status/Progress Goal Weekly Team Focus  Bowel/Bladder   Continent of bowel and bladder  remain continent  toliet q 2-3 hours   Swallow/Nutrition/ Hydration             ADL's   min A bathing, Mod A LB dressing, max A toileting, slideboard transfers to bariatric drop arm BSC with min-CGA  supervision- transfers, mod I for most self care  AE education, AROM and AAROM of LUE, endurance, transfers   Mobility   bed mobility min A, slideboard transfers min A +2  supervision/CGA  functional mobility/transfers, generalized strengthening and ROM, D/C planning, and endurance   Communication             Safety/Cognition/ Behavioral Observations            Pain   6-9 pain on L arm and r leg  medications that reduce pain  Keeping pain controlled   Skin   foam dressings to keep clean dry and intact  Healing  Keeping dressings and wounds clean, dry, and intact     Discharge Planning:  discharging home with assistance from son and daughter, 24/7 avaliable   Team Discussion: Affect, pain, BP improved. Numbness to RUE will require long term healing. Fractures will require long term  healing. Will ask surgery to assess one additional time before discharge to provide better understanding of healing process for patient. Having more neuropathic pain. Right thigh incision, currently has staples. All incisions CDI, no s/s infection. Discharging home with son and daughter to provide 24/7 care.  Patient on target to meet rehab goals: yes, supervision to mod I goals. Currently min assist bed mobility. Contact guard to close supervision with slide board. Max assist toileting. Will need 20/18 WC with elevating leg rest, slide board, and bariatric BSC. Recommending outpatient PT when WB restrictions are lifted, and HHOT.  *See Care Plan and progress notes for long and short-term goals.   Revisions to Treatment Plan:  Adjusting medications   Teaching Needs: Family education, medication/pain management, skin/wound care, slide board training, transfer training, etc.   Current Barriers to Discharge: Decreased caregiver support, Home enviroment access/layout, Wound care, Weight, Weight bearing restrictions, and Medication compliance  Possible Resolutions to Barriers: Family education Order recommended DME Follow-up Outpatient PT Follow-up HH-OT     Medical Summary Current Status: pain and sleep have improved, numbness in her left upper extremity, morbid obesity  Barriers to Discharge: Medical stability;Weight bearing restrictions;Wound care;Weight  Barriers to Discharge Comments: pain and sleep have improved, numbness in her left upper extremity, morbid obesity Possible Resolutions to [employer] Focus: continue current pain regimen, increased her amitriptyline to 25mg  and will maintain this  dose, continue daily wound care, will ask surgery to eval prior to d/c   Continued Need for Acute Rehabilitation Level of Care: The patient requires daily medical management by a physician with specialized training in physical medicine and rehabilitation for the following  reasons: Direction of a multidisciplinary physical rehabilitation program to maximize functional independence : Yes Medical management of patient stability for increased activity during participation in an intensive rehabilitation regime.: Yes Analysis of laboratory values and/or radiology reports with any subsequent need for medication adjustment and/or medical intervention. : Yes   I attest that I was present, lead the team conference, and concur with the assessment and plan of the team.   Tennis Must 12/27/2021, 12:10 PM

## 2021-12-27 NOTE — Progress Notes (Signed)
Occupational Therapy Session Note  Patient Details  Name: Elizabeth Walsh MRN: [id number] Date of Birth: July 10, 1966  Today's Date: 12/27/2021 OT Individual Time: 4656-8127 OT Individual Time Calculation (min): 23 min    Short Term Goals: Week 1:  OT Short Term Goal 1 (Week 1): Pt will donn LB clothing with mod A with LRAD OT Short Term Goal 2 (Week 1): Pt will report a pain level < 5/10 in LUE, consecutively for 2 sessions to promote functional use of non-dominant extremity OT Short Term Goal 3 (Week 1): Pt will complete slideboard transfer to bariatric BSC with no more than +2 min A  Skilled Therapeutic Interventions/Progress Updates:  Skilled OT intervention completed with focus on DME and family education regarding d/c plans. Pt received seated in w/c, with son present. Pt and family with several questions about the rehab care team meeting and equipment for pt to use at home. Discussed with pt and pt's son about this therapist recommending bariatric drop arm BSC for toileting for safety with slideboard method, as well as HHOT recommendation for focus on LUE at home due to [employer] restrictions and difficulty with OP appointments. Discussed purpose of HHOT and their focus/role. Educated pt on CSW role and therapist notifying care team to get family education scheduled for car and shower transfers. Pt left seated in w/c, chair alarm on and all needs in reach at end of session.    Therapy Documentation Precautions:  Precautions Precautions: Fall Precaution Comments: CAM boot for LLE Required Braces or Orthoses: Sling, Other Brace Other Brace: LUE sling for comfort, wrist cock-up splint during day, brace for night time Restrictions Weight Bearing Restrictions: Yes LUE Weight Bearing: Weight bearing as tolerated RLE Weight Bearing: Non weight bearing LLE Weight Bearing: Partial weight bearing LLE Partial Weight Bearing Percentage or Pounds:  (50%)  Pain: No c/o pain   Therapy/Group:  Individual Therapy  Keandre Linden E Denim Kalmbach 12/27/2021, 7:31 AM

## 2021-12-27 NOTE — Progress Notes (Signed)
PROGRESS NOTE   Subjective/Complaints: She asks about the numbness in her LUE and how long this will last for Team conference today She asks when the pins will be removed She asks about the swelling in her lower extremities    ROS:  Pt denies SOB, abd pain, CP, N/V/C/D, and vision changes, pain improved    Objective:   No results found. Recent Labs    12/25/21 0506 12/26/21 0522  WBC 11.2* 10.1  HGB 8.6* 8.6*  HCT 26.3* 26.3*  PLT 446* 421*   Recent Labs    12/25/21 0506  NA 134*  K 4.2  CL 101  CO2 23  GLUCOSE 106*  BUN 10  CREATININE 0.61  CALCIUM 8.4*    Intake/Output Summary (Last 24 hours) at 12/27/2021 1405 Last data filed at 12/27/2021 0550 Gross per 24 hour  Intake --  Output 400 ml  Net -400 ml        Physical Exam: Vital Signs Blood pressure 107/65, pulse 83, temperature 98.4 F (36.9 C), temperature source Oral, resp. rate 16, height 5\' 4"  (1.626 m), weight 96.7 kg, SpO2 100 %. Gen: no distress, normal appearing, BMI 36.59 HEENT: oral mucosa pink and moist, NCAT Cardio: Reg rate Chest: normal effort, normal rate of breathing Abd: soft, non-distended Ext: no edema Psych: pleasant, normal affect Skin: intact Neurological: Ox3 Ext: +edema- but mild of R toes- foot warm Skin: post-surgical incisions C/D/I Neuro: Aox3 Musculoskeletal: LLE medial malleolus tender to palpation with bruising and swelling. No calf TTP.  RLE splint in place.  LUE sutures C/D/    Assessment/Plan: 1. Functional deficits which require 3+ hours per day of interdisciplinary therapy in a comprehensive inpatient rehab setting. Physiatrist is providing close team supervision and 24 hour management of active medical problems listed below. Physiatrist and rehab team continue to assess barriers to discharge/monitor patient progress toward functional and medical goals  Care Tool:  Bathing    Body parts bathed by  patient: Left arm, Chest, Abdomen, Face   Body parts bathed by helper: Right arm     Bathing assist Assist Level: Minimal Assistance - Patient > 75%     Upper Body Dressing/Undressing Upper body dressing   What is the patient wearing?: Pull over shirt    Upper body assist Assist Level: Moderate Assistance - Patient 50 - 74%    Lower Body Dressing/Undressing Lower body dressing      What is the patient wearing?: Pants     Lower body assist Assist for lower body dressing: Maximal Assistance - Patient 25 - 49%     Toileting Toileting    Toileting assist Assist for toileting: Moderate Assistance - Patient 50 - 74%     Transfers Chair/bed transfer  Transfers assist     Chair/bed transfer assist level: Supervision/Verbal cueing Chair/bed transfer assistive device: Sliding board   Locomotion Ambulation   Ambulation assist   Ambulation activity did not occur: Safety/medical concerns          Walk 10 feet activity   Assist  Walk 10 feet activity did not occur: Safety/medical concerns        Walk 50 feet activity   Assist  Walk 50 feet with 2 turns activity did not occur: Safety/medical concerns         Walk 150 feet activity   Assist Walk 150 feet activity did not occur: Safety/medical concerns         Walk 10 feet on uneven surface  activity   Assist Walk 10 feet on uneven surfaces activity did not occur: Safety/medical concerns         Wheelchair     Assist Is the patient using a wheelchair?: Yes Type of Wheelchair: Manual    Wheelchair assist level: Supervision/Verbal cueing Max wheelchair distance: 73ft    Wheelchair 50 feet with 2 turns activity    Assist        Assist Level: Supervision/Verbal cueing   Wheelchair 150 feet activity     Assist      Assist Level: Supervision/Verbal cueing   Blood pressure 107/65, pulse 83, temperature 98.4 F (36.9 C), temperature source Oral, resp. rate 16, height 5'  4" (1.626 m), weight 96.7 kg, SpO2 100 %.    Medical Problem List and Plan: 1. Functional deficits secondary to polytrauma             -patient may shower             -ELOS/Goals: 10-14 days MinA/S             HFU scheduled in June  -transition to [facility]. Appreciate pharmacy assistance. Copay will be $60. Discussed this will continue until ortho clearance.   Continue CIR- PT and OT  -Interdisciplinary Team Conference today   2. Insomnia: continue amitriptyline to 25mg  HS 3. Postoperative pain: continue Tylenol, OxyContin 10 to 15 mg, decrease Robaxin to 750mg  q6H while awake due to hypotension, Neurontin. Recommended icing for edema and pain control. Discussed this regimen with patient.  4. Tearful regarding pain/current condition: add amitirptyline as above which will help with pain and situational depression 5. Neuropsych: This patient is capable of making decisions on her own behalf. 6. Skin/Wound Care: Routine skin care checks --Monitor scalp hematoma  --Monitor surgical incisions --Keep right lower extremity dressing intact until follow-up --LUE dressing change as needed. May get incision wet. 7. Fluids/Electrolytes/Nutrition: Routine ins and outs and follow-up chemistries 8.  Right femur fracture-status post ORIF>>NWB. Requested ortho follow-up to answer some of her orthopedic questions             --LLE Venous duplex Doppler negative for clot.  9.  Right midfoot dislocation-status post CRPF, short leg splint>>NWB             -- RLE Venous duplex Doppler to rule out DVT 10.  Left humeral fracture-status post ORIF>>WBAT 11: Left radial nerve palsy-sling for comfort, wrist cock-up splint 12.  Multiple bilateral rib fractures-Multimodal pain control             -- Pulmonary toilet 13.  Acute blood loss anemia status post 1 unit PRBCs             -- Monitor CBC 14: Vitamin D deficiency: Level 19 on 12/15/21-change supplement to ergocalciferol 50,000U once per week for 7 weeks.  15:  Left medial malleolus fracture>>Cam boot with therapies. Discussed with patient. Nonweightbearing as per ortho recommendations. 16. Hypotension: d/c HCTZ.   2/6- will decrease Lisinopril to 10 mg daily today and that will give more room for taking pain meds, muscle relants.   2/7: discussed that BP is now better controlled.  17. L hand numbness/tingling: discussed timeline for recovery. 18.  Obesity: BMI 36.59- provide dietary education    LOS: 7 days A FACE TO FACE EVALUATION WAS PERFORMED  Ravis Herne P Saharah Sherrow 12/27/2021, 2:05 PM

## 2021-12-28 NOTE — Progress Notes (Signed)
PROGRESS NOTE   Subjective/Complaints: She asks about her blood pressure and was low again this morning to 80s/50s. Appreciate Tobi Bastos rechecking and it improved to 112/60s.  Some increased pain inf left ankle.    ROS:  Pt denies SOB, abd pain, CP, N/V/C/D, and vision changes, pain overall improved but a little worse in her left ankle.     Objective:   No results found. Recent Labs    12/26/21 0522  WBC 10.1  HGB 8.6*  HCT 26.3*  PLT 421*   No results for input(s): NA, K, CL, CO2, GLUCOSE, BUN, CREATININE, CALCIUM in the last 72 hours.   Intake/Output Summary (Last 24 hours) at 12/28/2021 1114 Last data filed at 12/28/2021 0839 Gross per 24 hour  Intake 358 ml  Output 975 ml  Net -617 ml        Physical Exam: Vital Signs Blood pressure 109/62, pulse 87, temperature 99.6 F (37.6 C), temperature source Oral, resp. rate 18, height 5\' 4"  (1.626 m), weight 96.7 kg, SpO2 99 %. Gen: no distress, normal appearing, BMI 36.59 HEENT: oral mucosa pink and moist, NCAT Cardio: Reg rate Chest: normal effort, normal rate of breathing Abd: soft, non-distended Ext: no edema Psych: pleasant, normal affect Skin: intact Neurological: Ox3 Ext: +edema- but mild of R toes- foot warm Skin: post-surgical incisions C/D/I Neuro: Aox3 Musculoskeletal: LLE medial malleolus tender to palpation with bruising and swelling. No calf TTP.  RLE splint in place.  LUE sutures C/D/  Left ankle with some increased swelling.    Assessment/Plan: 1. Functional deficits which require 3+ hours per day of interdisciplinary therapy in a comprehensive inpatient rehab setting. Physiatrist is providing close team supervision and 24 hour management of active medical problems listed below. Physiatrist and rehab team continue to assess barriers to discharge/monitor patient progress toward functional and medical goals  Care Tool:  Bathing    Body parts  bathed by patient: Left arm, Chest, Abdomen, Face   Body parts bathed by helper: Right arm     Bathing assist Assist Level: Minimal Assistance - Patient > 75%     Upper Body Dressing/Undressing Upper body dressing   What is the patient wearing?: Pull over shirt    Upper body assist Assist Level: Moderate Assistance - Patient 50 - 74%    Lower Body Dressing/Undressing Lower body dressing      What is the patient wearing?: Pants     Lower body assist Assist for lower body dressing: Maximal Assistance - Patient 25 - 49%     Toileting Toileting    Toileting assist Assist for toileting: Moderate Assistance - Patient 50 - 74%     Transfers Chair/bed transfer  Transfers assist     Chair/bed transfer assist level: Supervision/Verbal cueing Chair/bed transfer assistive device: Sliding board   Locomotion Ambulation   Ambulation assist   Ambulation activity did not occur: Safety/medical concerns          Walk 10 feet activity   Assist  Walk 10 feet activity did not occur: Safety/medical concerns        Walk 50 feet activity   Assist Walk 50 feet with 2 turns activity did not  occur: Safety/medical concerns         Walk 150 feet activity   Assist Walk 150 feet activity did not occur: Safety/medical concerns         Walk 10 feet on uneven surface  activity   Assist Walk 10 feet on uneven surfaces activity did not occur: Safety/medical concerns         Wheelchair     Assist Is the patient using a wheelchair?: Yes Type of Wheelchair: Manual    Wheelchair assist level: Set up assist, Supervision/Verbal cueing Max wheelchair distance: 23ft in room    Wheelchair 50 feet with 2 turns activity    Assist        Assist Level: Supervision/Verbal cueing   Wheelchair 150 feet activity     Assist      Assist Level: Supervision/Verbal cueing   Blood pressure 109/62, pulse 87, temperature 99.6 F (37.6 C), temperature  source Oral, resp. rate 18, height 5\' 4"  (1.626 m), weight 96.7 kg, SpO2 99 %.    Medical Problem List and Plan: 1. Functional deficits secondary to polytrauma             -patient may shower             -ELOS/Goals: 10-14 days MinA/S             HFU scheduled in June  -transition to [facility]. Appreciate pharmacy assistance. Copay will be $60. Discussed this will continue until ortho clearance.   Continue CIR- PT and OT  2. Insomnia: continue amitriptyline to 25mg  HS 3. Postoperative pain: continue Tylenol, OxyContin 10 to 15 mg, decrease Robaxin to 750mg  q6H while awake due to hypotension, Neurontin. Recommended icing for edema and pain control. Discussed this regimen with patient.  4. Tearful regarding pain/current condition: add amitirptyline as above which will help with pain and situational depression 5. Neuropsych: This patient is capable of making decisions on her own behalf. 6. Skin/Wound Care: Routine skin care checks --Monitor scalp hematoma  --Monitor surgical incisions --Keep right lower extremity dressing intact until follow-up --LUE dressing change as needed. May get incision wet. 7. Fluids/Electrolytes/Nutrition: Routine ins and outs and follow-up chemistries 8.  Right femur fracture-status post ORIF>>NWB. Requested ortho follow-up to answer some of her orthopedic questions             --LLE Venous duplex Doppler negative for clot.  9.  Right midfoot dislocation-status post CRPF, short leg splint>>NWB             -- RLE Venous duplex Doppler to rule out DVT 10.  Left humeral fracture-status post ORIF>>WBAT 11: Left radial nerve palsy-sling for comfort, wrist cock-up splint. Discussed prognosis of nerve recovery.  12.  Multiple bilateral rib fractures-Multimodal pain control             -- Pulmonary toilet 13.  Acute blood loss anemia status post 1 unit PRBCs             -- Monitor CBC 14: Vitamin D deficiency: Level 19 on 12/15/21-change supplement to ergocalciferol 50,000U  once per week for 7 weeks.  15: Left medial malleolus fracture>>Cam boot with therapies. Discussed with patient. Nonweightbearing as per ortho recommendations.Discussed plan for repeat XR tomorrow.  16. Hypotension: d/c HCTZ. D/c Lisinopril. 17. L hand numbness/tingling: discussed timeline for recovery. 18. Obesity: BMI 36.59- provide dietary education    LOS: 8 days A FACE TO FACE EVALUATION WAS PERFORMED  Tilmon Wisehart P Terrika Zuver 12/28/2021, 11:14 AM

## 2021-12-28 NOTE — Progress Notes (Signed)
Physical Therapy Session Note  Patient Details  Name: Elizabeth Walsh MRN: [id number] Date of Birth: [birth year]  Today's Date: 12/28/2021 PT Individual Time: 1420-1500 PT Individual Time Calculation (min): 40 min   Short Term Goals: Week 1:  PT Short Term Goal 1 (Week 1): pt will transfer supine<>sitting EOB with mod A of 1 PT Short Term Goal 1 - Progress (Week 1): Met PT Short Term Goal 2 (Week 1): pt will transfer bed<>chair with LRAD and max A of 1 PT Short Term Goal 2 - Progress (Week 1): Met PT Short Term Goal 3 (Week 1): pt initate WC mobility PT Short Term Goal 3 - Progress (Week 1): Met Week 2:  PT Short Term Goal 1 (Week 2): STG=LTG due to LOS   Skilled Therapeutic Interventions/Progress Updates:   Pt received sitting in WC and agreeable to PT. Pt requesting to get in bed due to fatigue from earlier PT and OT treatments.   SB transfer to bed with supervision assist and cues for NWB RLE. Supine>sit with min assist at the RLE.   Supine BLE therex. AAROM provided on the RLE due to fatigue. SAQ, hip abduction, hip/knee flexion, SLR. Each performed x 8 BLE. Ankle PF/DF on the LLE x 15 with cues for pain free ROM. Pt provided prolonded rest breaks between bouts due to fatigue. And pain management on the RLE. Pt reports decreased pain on the R with rest.       Therapy Documentation Precautions:  Precautions Precautions: Fall Precaution Comments: CAM boot for LLE Required Braces or Orthoses: Sling, Other Brace Other Brace: LUE sling for comfort, wrist cock-up splint during day, brace for night time Restrictions Weight Bearing Restrictions: Yes LUE Weight Bearing: Weight bearing as tolerated RLE Weight Bearing: Non weight bearing LLE Weight Bearing: Partial weight bearing LLE Partial Weight Bearing Percentage or Pounds:  (50%)  Pain: Pain Assessment Pain Score: 8  Pain Location: Leg (& bilateral arms) Pain Orientation: Right;Left     Therapy/Group: Individual  Therapy  Lorie Phenix 12/28/2021, 11:56 PM

## 2021-12-28 NOTE — Progress Notes (Signed)
Patient ID: Elizabeth Walsh, female   DOB: 24-Sep-1966, 56 y.o.   MRN: [id number]  Wheelchair, Bari Drop Arm and [employer] ordered through [employer].

## 2021-12-28 NOTE — Progress Notes (Signed)
Patient ID: Elizabeth Walsh, female   DOB: 1966-08-26, 55 y.o.   MRN: [id number]  Family education scheduled Saturday [facility]
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
## 2021-12-28 NOTE — Progress Notes (Signed)
Patient ID: Elizabeth Walsh, female   DOB: 1966-10-19, 56 y.o.   MRN: [id number]  Adapt contact information to patient and family

## 2021-12-28 NOTE — Progress Notes (Signed)
Occupational Therapy Session Note  Patient Details  Name: Elizabeth Walsh MRN: [id number] Date of Birth: [birth year]  Today's Date: 12/28/2021 OT Individual Time: 3382-5053 and 1315-1400 OT Individual Time Calculation (min): 44 min and 45 min Missed 15 mins in PM session requesting time to eat   Short Term Goals: Week 1:  OT Short Term Goal 1 (Week 1): Pt will donn LB clothing with mod A with LRAD OT Short Term Goal 2 (Week 1): Pt will report a pain level < 5/10 in LUE, consecutively for 2 sessions to promote functional use of non-dominant extremity OT Short Term Goal 3 (Week 1): Pt will complete slideboard transfer to bariatric BSC with no more than +2 min A   Skilled Therapeutic Interventions/Progress Updates:    Session 1: Pt greeted at time of session down in atrium, hand off from rec therapy as pt had to leave her room 2/2 maintenance being performed. Pt with some LUE discomfort but did not rate and still able to participate in most of session. Pt agreeable to go outside for OT session, transported atrium > outside area dependent for time and pt sitting in sunshine for uplifting mood and energy levels. Focused on LUE AAROM beginning at shoulder for flexion/extension, elbow flexion/extension, forearm supination/pronation, wrist flexion/extension, and digit flexion/extension. Reviewed with pt etiology of wrist drop, importance of splinting/brace to prevent contracture, and neuro recovery. Pt did have some active wrist extension today, approx 50% with ulnar deviation noted and Max A to correct this distally. Set up in atrium with son, confirmed with nursing pt has ground pass.   Session 2: Pt greeted at time of session in her room, family present and pt eating lunch. Requesting a few minutes at beginning of session to finish eating before OT session. Missed 15 mins at beginning of session to finish eating lunch. Resumed session and pt needing to toilet, backed pt into bathroom via wheelchair and  total A for slide board placement, Min A for slide board wheelchair <> DABSC over toilet, Mod A to fully get shorts down with cues for lateral leans, Max/total to get back over hips with multiple leans 2/2 fatigue. Slide board back to wheelchair Min A as well. Set up in chair with alarm on call bell in reach for next therapy session.   Therapy Documentation Precautions:  Precautions Precautions: Fall Precaution Comments: CAM boot for LLE Required Braces or Orthoses: Sling, Other Brace Other Brace: LUE sling for comfort, wrist cock-up splint during day, brace for night time Restrictions Weight Bearing Restrictions: Yes LUE Weight Bearing: Non weight bearing RLE Weight Bearing: Non weight bearing LLE Weight Bearing: Partial weight bearing LLE Partial Weight Bearing Percentage or Pounds:  (50%)    Therapy/Group: Individual Therapy  Erasmo Score 12/28/2021, 7:26 AM
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
## 2021-12-29 ENCOUNTER — Inpatient Hospital Stay (HOSPITAL_COMMUNITY): Payer: 59

## 2021-12-29 NOTE — Progress Notes (Signed)
Occupational Therapy Session Note  Patient Details  Name: Elizabeth Walsh MRN: [id number] Date of Birth: Aug 17, 1966  Today's Date: 12/29/2021 OT Individual Time: 1315-1415 OT Individual Time Calculation (min): 60 min  and Today's Date: 12/29/2021 OT Missed Time: 15 Minutes Missed Time Reason: Other (comment) (therapist in scheduled meeting with Gen 3 team that over ran past scheduled time)   Short Term Goals: Week 1:  OT Short Term Goal 1 (Week 1): Pt will donn LB clothing with mod A with LRAD OT Short Term Goal 2 (Week 1): Pt will report a pain level < 5/10 in LUE, consecutively for 2 sessions to promote functional use of non-dominant extremity OT Short Term Goal 3 (Week 1): Pt will complete slideboard transfer to bariatric BSC with no more than +2 min A  Skilled Therapeutic Interventions/Progress Updates:  Skilled OT intervention completed with focus on LB clothing management, AE education, and AROM exercises. Pt received seated in w/c with family present, and pt agreeable to session. Transported pt with total A in w/c to [facility] therapy gym. Pt completed slideboard transfer > L, with supervision for board placement and transfer to EOM. Therapist still had to secure w/c for safety during transfer as well as providing towel under RLE for comfort/NWB. With demonstration and education provided, pt utilized reacher to thread BLEs with supervision and cues only for efficiency. Once up to thighs, pt had increased difficulty with lateral leans for donning over hips, with most challenge on pt's L side due to decreased precision and strength in L hand from nerve injury. Discussed strategies for modifying and increasing her success with this task, however educated pt that until the hand had more return, she might need to have a little more assist on L side. Pt completed multiple leans L<>R with min A required for donning over L hip only. Pt able to doff with supervision using lateral lean technique as well as using  reacher to doff BLEs. Education provided to pt on where to purchase AE that can be used for self-care tasks. While EOM, pt completed x5 AROM of elbow flexion/extension. Pt completed slideboard transfer with supervision > R to w/c with total A provided for board placement for time management. Applied BLE leg rests, with RLE secured with gait belt per pt request/comfort, with pt left seated in w/c with family in room and all needs in reach at end of session. This therapist updated pt's safety plan for nursing to assist pt to [facility] via slideboard and +1 assist for future toilet transfers vs bed pan due to pt progress. Informed family that they are not cleared to assist pt to toilet at this time, with need of staff member to be present for assist.   Therapy Documentation Precautions:  Precautions Precautions: Fall Precaution Comments: CAM boot for LLE Required Braces or Orthoses: Sling, Other Brace Other Brace: LUE sling for comfort, wrist cock-up splint during day, brace for night time Restrictions Weight Bearing Restrictions: Yes LUE Weight Bearing: Non weight bearing RLE Weight Bearing: Non weight bearing LLE Weight Bearing: Partial weight bearing LLE Partial Weight Bearing Percentage or Pounds:  (50%)  Pain: Positional pain in RLE, provided support under foot during transfers for comfort   Therapy/Group: Individual Therapy  Susane Bey E Burrell Hodapp 12/29/2021, 7:45 AM
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
## 2021-12-29 NOTE — Progress Notes (Signed)
Occupational Therapy Session Note  Patient Details  Name: Elizabeth Walsh MRN: [id number] Date of Birth: [birth year]  Today's Date: 12/29/2021 OT Individual Time: 7375-0510 OT Individual Time Calculation (min): 60 min    Short Term Goals: Week 1:  OT Short Term Goal 1 (Week 1): Pt will donn LB clothing with mod A with LRAD OT Short Term Goal 1 - Progress (Week 1): Met OT Short Term Goal 2 (Week 1): Pt will report a pain level < 5/10 in LUE, consecutively for 2 sessions to promote functional use of non-dominant extremity OT Short Term Goal 2 - Progress (Week 1): Met OT Short Term Goal 3 (Week 1): Pt will complete slideboard transfer to bariatric BSC with no more than +2 min A OT Short Term Goal 3 - Progress (Week 1): Met  Skilled Therapeutic Interventions/Progress Updates:    Pt resting in w/c upon arrival. Family present. OT intervention with focus on LUE strengthening and functional use. Wrist cockup splint remained in place. Pt stacked cones placed on table. Pt grasped yellow clothes pins with placed on dowels and removed, requiring assist lifting LUE. Pt also issued red theraputty and completed following activities; forming into ball and shaping into snake followed by pinching. Pt had difficulty keeping her eyes open and commented that she needed a dose of "Vitamin D" with a trip outside. Pt transitioned to courtyard and affect improved immediately. Discussed discharge plans and ongoing therapies following discharge. Pt returned to room and remained in w/c awaiting PT. Daughter present. All needs within reach.   Therapy Documentation Precautions:  Precautions Precautions: Fall Precaution Comments: CAM boot for LLE Required Braces or Orthoses: Sling, Other Brace Other Brace: LUE sling for comfort, wrist cock-up splint during day, brace for night time Restrictions Weight Bearing Restrictions: Yes LUE Weight Bearing: Non weight bearing RLE Weight Bearing: Non weight bearing LLE Weight  Bearing: Partial weight bearing LLE Partial Weight Bearing Percentage or Pounds:  (50%) Pain:  Pt reports 4/10 pain in BLE; emotional support   Therapy/Group: Individual Therapy  Leroy Libman 12/29/2021, 9:56 AM

## 2021-12-29 NOTE — Plan of Care (Signed)
°  Problem: Consults °Goal: RH GENERAL PATIENT EDUCATION °Description: See Patient Education module for education specifics. °Outcome: Progressing °Goal: Skin Care Protocol Initiated - if Braden Score 18 or less °Description: If consults are not indicated, leave blank or document N/A °Outcome: Progressing °  °Problem: RH BOWEL ELIMINATION °Goal: RH STG MANAGE BOWEL WITH ASSISTANCE °Description: STG Manage Bowel with Supervision Assistance. °Outcome: Progressing °Goal: RH STG MANAGE BOWEL W/MEDICATION W/ASSISTANCE °Description: STG Manage Bowel with Medication with Mod I Assistance. °Outcome: Progressing °  °Problem: RH SKIN INTEGRITY °Goal: RH STG ABLE TO PERFORM INCISION/WOUND CARE W/ASSISTANCE °Description: STG Able To Perform Incision/Wound Care With Supervision Assistance. °Outcome: Progressing °  °Problem: RH SAFETY °Goal: RH STG ADHERE TO SAFETY PRECAUTIONS W/ASSISTANCE/DEVICE °Description: STG Adhere to Safety Precautions With Cues and Reminders. °Outcome: Progressing °Goal: RH STG DECREASED RISK OF FALL WITH ASSISTANCE °Description: STG Decreased Risk of Fall With Supervision Assistance. °Outcome: Progressing °  °Problem: RH PAIN MANAGEMENT °Goal: RH STG PAIN MANAGED AT OR BELOW PT'S PAIN GOAL °Description: < 3 on a 0-10 pain scale. °Outcome: Progressing °  °Problem: RH KNOWLEDGE DEFICIT GENERAL °Goal: RH STG INCREASE KNOWLEDGE OF SELF CARE AFTER HOSPITALIZATION °Description: Patient will demonstrate knowledge of medication/pain management, skin/wound care, and weight bearing precautions with educational materials and handouts provided by staff independently at discharge. °Outcome: Progressing °  °

## 2021-12-29 NOTE — Progress Notes (Signed)
Occupational Therapy Weekly Progress Note  Patient Details  Name: Elizabeth Walsh MRN: [id number] Date of Birth: [birth year]  Beginning of progress report period: December 21, 2021 End of progress report period: December 29, 2021    Patient has met 3 of 3 short term goals.  Pt is making steady progress towards LTGs.   Pt has progressed functional transfers from slideboard Max +2 assist to supervision of 1 assist via same method. Has progressed bathing ability from bed level at total A to EOB or w/c level with min A, is able to dress at an overall min vs max A level and requires min vs total assist for toileting tasks. Pt has had a greater return of functional use and AROM in the LUE. Pt continues to demonstrate challenges with LB dressing due to lateral leans with WB precautions, pain level, and decreased L hand control from the nerve injury that makes BUE tasks more difficult, and would benefit from continued practice with theses tasks to increase her independence prior to d/c. Pt and family are planning to complete a family/therapy education session prior to d/c for hands on training with toilet/shower transfers.   Patient continues to demonstrate the following deficits: muscle weakness and muscle joint tightness, decreased cardiorespiratoy endurance, and impaired timing and sequencing and decreased coordination and therefore will continue to benefit from skilled OT intervention to enhance overall performance with BADL and Reduce care partner burden.  Patient progressing toward long term goals..  Continue plan of care.  OT Short Term Goals Week 1:  OT Short Term Goal 1 (Week 1): Pt will donn LB clothing with mod A with LRAD OT Short Term Goal 1 - Progress (Week 1): Met OT Short Term Goal 2 (Week 1): Pt will report a pain level < 5/10 in LUE, consecutively for 2 sessions to promote functional use of non-dominant extremity OT Short Term Goal 2 - Progress (Week 1): Met OT Short Term Goal 3 (Week  1): Pt will complete slideboard transfer to bariatric BSC with no more than +2 min A OT Short Term Goal 3 - Progress (Week 1): Met Week 2:  OT Short Term Goal 1 (Week 2): STG=LTG due to [facility] [employer] Stocking 12/29/2021, 8:00 AM

## 2021-12-29 NOTE — Progress Notes (Signed)
Physical Therapy Session Note  Patient Details  Name: Elizabeth Walsh MRN: [id number] Date of Birth: 04-04-1966  Today's Date: 12/29/2021 PT Individual Time: 4098-1191 PT Individual Time Calculation (min): 53 min   Short Term Goals: Week 1:  PT Short Term Goal 1 (Week 1): pt will transfer supine<>sitting EOB with mod A of 1 PT Short Term Goal 1 - Progress (Week 1): Met PT Short Term Goal 2 (Week 1): pt will transfer bed<>chair with LRAD and max A of 1 PT Short Term Goal 2 - Progress (Week 1): Met PT Short Term Goal 3 (Week 1): pt initate WC mobility PT Short Term Goal 3 - Progress (Week 1): Met Week 2:  PT Short Term Goal 1 (Week 2): STG=LTG due to LOS  Skilled Therapeutic Interventions/Progress Updates:   Received pt sitting in WC with L CAM boot  and L wrist splint donned. Pt agreeable to PT treatment and reported pain 5/10 in LUE and bilateral ankles (premedicated). Session with emphasis on functional mobility/transfers, generalized strengthening/ROM, and endurance. Reviewed HEP and MD present for morning rounds confirming x-ray to be done sometime today. Pt transported to/from [facility] in [facility] dependently for time management purposes. Pt transferred on/off mat via slideboard with supervision - pt able to place/remove board with increased time but without assist and required min cues for transfer set up. Pt performed the following exercises sitting EOM with supervision and verbal cues for technique: -LAQ 2x10 bilaterally -hip flexion 2x10 bilaterally -trunk rotations with 2.2lb medicine ball x10 bilaterally - limited by pain in ribs -bicep curls with 2lb dowel x8 - limited by tightness in biceps Pt required multiple rest/water breaks throughout session and appeared groggy and fatigued throughout. Upon returning to room, reviewed WC parts management with daughter and educated daughter on how to bump pt up/down small curb/threshold for community navigation. Concluded session with pt sitting in [facility]  with all needs within reach, planning to go outside with daughter prior to upcoming OT session.   Therapy Documentation Precautions:  Precautions Precautions: Fall Precaution Comments: CAM boot for LLE Required Braces or Orthoses: Sling, Other Brace Other Brace: LUE sling for comfort, wrist cock-up splint during day, brace for night time Restrictions Weight Bearing Restrictions: Yes LUE Weight Bearing: Non weight bearing RLE Weight Bearing: Non weight bearing LLE Weight Bearing: Partial weight bearing LLE Partial Weight Bearing Percentage or Pounds:  (50%)  Therapy/Group: Individual Therapy Alfonse Alpers PT, DPT   12/29/2021, 7:16 AM

## 2021-12-29 NOTE — Progress Notes (Signed)
Orthopaedic Trauma Progress Note  Doing fairly well today, on her way to go outside to get some sun/fresh air with her son. Pain has been manageable. Therapies are progressing well. Scheduled for d/c on 01/02/22. Still awaiting repeat x-rays to be done today.    ASSESSMENT: Elizabeth Walsh is a 56 y.o. female s/p ORIF LEFT HUMERUS by Dr. Doreatha Martin 12/15/21 CLOSED REDUCTION PERCUTANEOUS PINNING RIGHT FOOT  Dr. Doreatha Martin 12/15/21 IRRIGATION & DEBRIDEMENT WITH IMN RIGHT FEMUR by Dr. Zachery Dakins 12/13/21   PLAN: Weightbearing:  - RLE: NWB - LLE: WB for transfers, otherwise no more than 50% PWB for mobility - LUE: WBAT Incisional and dressing care:  - RLE: splint left in place - LUE: change PRN Showering: Ok to show, keep splint dry. LUE incision may get wet Orthopedic device(s):  - RLE: short leg splint - LLE: CAM boot - LUE: Radial nerve palsy splint  Pain management: continue current regimen VTE prophylaxis: Lovenox, SCDs Impediments to Fracture Healing: Vitamin D level 19, continue D3 supplementation. Dispo: Continue care per CIR. Repeat x-rays R foot, L humerus, R femur ordered for today. Will determine whether to switch patient to CAM boot on RLE once imaging done.   D/C recommendation: - Continue Eliquis x21 days at discharge - Continue D3 supplementation x90 days  Follow - up plan: We will continue to follow while in hospital and plan for outpatient follow-up 2 weeks after discharge for repeat x-rays and wound check  Contact information:  Elizabeth Hamming MD, Elizabeth Nyhan PA-C. After hours and holidays please check [email or web address] for group call information for [facility]   Elizabeth Passe, PA-C [phone] (office) [email or web address]

## 2021-12-30 MED ORDER — HYDROCORTISONE (PERIANAL) 2.5 % EX CREA
TOPICAL_CREAM | Freq: Two times a day (BID) | CUTANEOUS | Status: DC
  Filled 2021-12-30: qty 28.35

## 2021-12-30 NOTE — Progress Notes (Signed)
Physical Therapy Session Note ° °Patient Details  °Name: Elizabeth Walsh °MRN: [id number] °Date of Birth: 11/22/1965 ° °Today's Date: 12/30/2021 °PT Individual Time: 1000-1054 °PT Individual Time Calculation (min): 54 min   °Today's Date: 12/30/2021 °PT Missed Time: 21 Minutes °Missed Time Reason: Patient fatigue ° °Short Term Goals: °Week 1:  PT Short Term Goal 1 (Week 1): pt will transfer supine<>sitting EOB with mod A of 1 °PT Short Term Goal 1 - Progress (Week 1): Met °PT Short Term Goal 2 (Week 1): pt will transfer bed<>chair with LRAD and max A of 1 °PT Short Term Goal 2 - Progress (Week 1): Met °PT Short Term Goal 3 (Week 1): pt initate WC mobility °PT Short Term Goal 3 - Progress (Week 1): Met °Week 2:  PT Short Term Goal 1 (Week 2): STG=LTG due to LOS ° °Skilled Therapeutic Interventions/Progress Updates:  ° Received pt sitting in WC; handoff from OT for family education training. LLE CAM boot and LUE wrist splint donned. Pt agreeable to PT treatment and did not state pain level during session. Session with emphasis on discharge planning, functional mobility/transfers, generalized strengthening, real car transfers, and improved activity tolerance. Pt transported to entrance of [facility] in WC dependently for time management purposes. Pt performed car transfer with slideboard and CGA overall. Educated pt's family on WC parts management including donning/doffing legrests and locking brakes, as well as proper transfer set up (specifically how to angle WC next to car and correct positioning of slideboard). Pt did require cues to turn around and place feet on ground prior to transferring out of car for safety and to maintain RLE NWB precautions. Pt and family verbalized confidence with task and declined practicing again. Pt transported to ADL apartment in WC dependently and practice transferring on/off higher bed, as pt's family reports her bed is very high. Pt still able to perform transfer on/off high sitting bed with  supervision with increased effort. Pt required rest breaks throughout session due to fatigue and politely declined practicing any more transfers due to fatigue. Pt transported back to room in WC total A and therapist answered questions regarding D/C times and expectations as well as equipment. Concluded session with pt sitting in WC with all needs within reach and family present at bedside. 21 minutes missed of skilled physical therapy due to fatigue.  ° °Therapy Documentation °Precautions:  °Precautions °Precautions: Fall °Precaution Comments: CAM boot for LLE °Required Braces or Orthoses: Sling, Other Brace °Other Brace: LUE sling for comfort, wrist cock-up splint during day, brace for night time °Restrictions °Weight Bearing Restrictions: Yes °LUE Weight Bearing: Weight bearing as tolerated °RLE Weight Bearing: Non weight bearing °LLE Weight Bearing: Weight bearing as tolerated °LLE Partial Weight Bearing Percentage or Pounds:  (50%) ° °Therapy/Group: Individual Therapy °Anna M Johnson °Anna Johnson PT, DPT  ° °12/30/2021, 7:17 AM  °

## 2021-12-30 NOTE — Progress Notes (Signed)
Occupational Therapy Session Note  Patient Details  Name: Elizabeth Walsh MRN: [id number] Date of Birth: 09/19/1966  Today's Date: 12/30/2021 OT Individual Time: 1572-6203 OT Individual Time Calculation (min): 55 min    Short Term Goals: Week 2:  OT Short Term Goal 1 (Week 2): STG=LTG due to ELOS  Skilled Therapeutic Interventions/Progress Updates:  Skilled OT intervention completed with focus on family education with pt's son, DIL and daughter present. Pt received seated in w/c, agreeable to session. Pt transported in w/c to gym across the hall, with therapist using pt's BDABSC in gym for toilet transfer hands on education and demonstration. Therapist demonstrated toilet transfer via slideboard with total A for board placements however supervision for transfer from w/c <> commode. Education provided on safety strategies including securing w/c, board placement, supervising the movement of the board, efficiency techniques for sliding, and supervising pt's L hand to prevent board crushing fingers due to decreased sensation. Pt's son participated in exact same transfer, with pt at supervision level, and son placing board for pt, with good return demonstration and safety awareness. Education provided on how to assemble the [facility] and placement tips for over toilet for creating level surface from w/c to commode. MD in room for rounds. Pt's family with questions about HHOT as CSW reported no in home services, with therapist informing family that therapist would check in about that with the team, as pt could benefit from at least OPOT for focus on LUE for healing and function restoration. Pt left seated in room, with chair alarm on, family in room and all needs in reach at end of session.    Therapy Documentation Precautions:  Precautions Precautions: Fall Precaution Comments: CAM boot for LLE Required Braces or Orthoses: Sling, Other Brace Other Brace: LUE sling for comfort, wrist cock-up splint  during day, brace for night time Restrictions Weight Bearing Restrictions: Yes LUE Weight Bearing: Weight bearing as tolerated RLE Weight Bearing: Non weight bearing LLE Weight Bearing: Weight bearing as tolerated LLE Partial Weight Bearing Percentage or Pounds:  (50%)  Pain: Minimal unrated pain during transfers, improved with positional support   Therapy/Group: Individual Therapy  Elizabeth Walsh 12/30/2021, 7:44 AM

## 2021-12-30 NOTE — Plan of Care (Signed)
°  Problem: Consults Goal: RH GENERAL PATIENT EDUCATION Description: See Patient Education module for education specifics. Outcome: Progressing Goal: Skin Care Protocol Initiated - if Braden Score 18 or less Description: If consults are not indicated, leave blank or document N/A Outcome: Progressing   Problem: RH BOWEL ELIMINATION Goal: RH STG MANAGE BOWEL WITH ASSISTANCE Description: STG Manage Bowel with Supervision Assistance. Outcome: Progressing Goal: RH STG MANAGE BOWEL W/MEDICATION W/ASSISTANCE Description: STG Manage Bowel with Medication with Mod I Assistance. Outcome: Progressing   Problem: RH SKIN INTEGRITY Goal: RH STG ABLE TO PERFORM INCISION/WOUND CARE W/ASSISTANCE Description: STG Able To Perform Incision/Wound Care With Supervision Assistance. Outcome: Progressing   Problem: RH SAFETY Goal: RH STG ADHERE TO SAFETY PRECAUTIONS W/ASSISTANCE/DEVICE Description: STG Adhere to Safety Precautions With Cues and Reminders. Outcome: Progressing Goal: RH STG DECREASED RISK OF FALL WITH ASSISTANCE Description: STG Decreased Risk of Fall With Supervision Assistance. Outcome: Progressing   Problem: RH PAIN MANAGEMENT Goal: RH STG PAIN MANAGED AT OR BELOW PT'S PAIN GOAL Description: < 3 on a 0-10 pain scale. Outcome: Progressing   Problem: RH KNOWLEDGE DEFICIT GENERAL Goal: RH STG INCREASE KNOWLEDGE OF SELF CARE AFTER HOSPITALIZATION Description: Patient will demonstrate knowledge of medication/pain management, skin/wound care, and weight bearing precautions with educational materials and handouts provided by staff independently at discharge. Outcome: Progressing

## 2021-12-31 NOTE — Plan of Care (Signed)
°  Problem: Consults °Goal: RH GENERAL PATIENT EDUCATION °Description: See Patient Education module for education specifics. °Outcome: Progressing °Goal: Skin Care Protocol Initiated - if Braden Score 18 or less °Description: If consults are not indicated, leave blank or document N/A °Outcome: Progressing °  °Problem: RH BOWEL ELIMINATION °Goal: RH STG MANAGE BOWEL WITH ASSISTANCE °Description: STG Manage Bowel with Supervision Assistance. °Outcome: Progressing °Goal: RH STG MANAGE BOWEL W/MEDICATION W/ASSISTANCE °Description: STG Manage Bowel with Medication with Mod I Assistance. °Outcome: Progressing °  °Problem: RH SKIN INTEGRITY °Goal: RH STG ABLE TO PERFORM INCISION/WOUND CARE W/ASSISTANCE °Description: STG Able To Perform Incision/Wound Care With Supervision Assistance. °Outcome: Progressing °  °Problem: RH SAFETY °Goal: RH STG ADHERE TO SAFETY PRECAUTIONS W/ASSISTANCE/DEVICE °Description: STG Adhere to Safety Precautions With Cues and Reminders. °Outcome: Progressing °Goal: RH STG DECREASED RISK OF FALL WITH ASSISTANCE °Description: STG Decreased Risk of Fall With Supervision Assistance. °Outcome: Progressing °  °Problem: RH PAIN MANAGEMENT °Goal: RH STG PAIN MANAGED AT OR BELOW PT'S PAIN GOAL °Description: < 3 on a 0-10 pain scale. °Outcome: Progressing °  °Problem: RH KNOWLEDGE DEFICIT GENERAL °Goal: RH STG INCREASE KNOWLEDGE OF SELF CARE AFTER HOSPITALIZATION °Description: Patient will demonstrate knowledge of medication/pain management, skin/wound care, and weight bearing precautions with educational materials and handouts provided by staff independently at discharge. °Outcome: Progressing °  °

## 2021-12-31 NOTE — Progress Notes (Signed)
PROGRESS NOTE   Subjective/Complaints:  Pt reports she wants to go over xrays- doing family training- Day of rest today- pt happy about this.    ROS:   Pt denies SOB, abd pain, CP, N/V/C/D, and vision changes   Objective:   No results found. No results for input(s): WBC, HGB, HCT, PLT in the last 72 hours.  No results for input(s): NA, K, CL, CO2, GLUCOSE, BUN, CREATININE, CALCIUM in the last 72 hours.  No intake or output data in the 24 hours ending 12/31/21 1752       Physical Exam: Vital Signs Blood pressure 118/73, pulse 89, temperature 98 F (36.7 C), temperature source Oral, resp. rate 18, height 5\' 4"  (1.626 m), weight 96.7 kg, SpO2 100 %.   General: awake, alert, appropriate, NAD HENT: conjugate gaze; oropharynx moist CV: regular rate; no JVD Pulmonary: CTA B/L; no W/R/R- good air movement GI: soft, NT, ND, (+)BS Psychiatric: appropriate Neurological: Ox3 Skin: intact Neurological: Ox3 Ext: +edema- but mild of R toes- foot warm- no change Skin: post-surgical incisions C/D/I- esp notable on L humeral incision Neuro: Aox3 Musculoskeletal: LLE medial malleolus tender to palpation with bruising and swelling. No calf TTP.  RLE splint in place.  LUE sutures C/D/  Left ankle with some increased swelling.  Hand strength improving   Assessment/Plan: 1. Functional deficits which require 3+ hours per day of interdisciplinary therapy in a comprehensive inpatient rehab setting. Physiatrist is providing close team supervision and 24 hour management of active medical problems listed below. Physiatrist and rehab team continue to assess barriers to discharge/monitor patient progress toward functional and medical goals  Care Tool:  Bathing    Body parts bathed by patient: Left arm, Chest, Abdomen, Face   Body parts bathed by helper: Right arm     Bathing assist Assist Level: Minimal Assistance - Patient >  75%     Upper Body Dressing/Undressing Upper body dressing   What is the patient wearing?: Pull over shirt    Upper body assist Assist Level: Moderate Assistance - Patient 50 - 74%    Lower Body Dressing/Undressing Lower body dressing      What is the patient wearing?: Pants     Lower body assist Assist for lower body dressing: Minimal Assistance - Patient > 75%     Toileting Toileting    Toileting assist Assist for toileting: Moderate Assistance - Patient 50 - 74%     Transfers Chair/bed transfer  Transfers assist     Chair/bed transfer assist level: Supervision/Verbal cueing Chair/bed transfer assistive device: Sliding board   Locomotion Ambulation   Ambulation assist   Ambulation activity did not occur: Safety/medical concerns          Walk 10 feet activity   Assist  Walk 10 feet activity did not occur: Safety/medical concerns        Walk 50 feet activity   Assist Walk 50 feet with 2 turns activity did not occur: Safety/medical concerns         Walk 150 feet activity   Assist Walk 150 feet activity did not occur: Safety/medical concerns         Walk 10 feet  on uneven surface  activity   Assist Walk 10 feet on uneven surfaces activity did not occur: Safety/medical concerns         Wheelchair     Assist Is the patient using a wheelchair?: Yes Type of Wheelchair: Manual    Wheelchair assist level: Set up assist, Supervision/Verbal cueing Max wheelchair distance: 51ft in room    Wheelchair 50 feet with 2 turns activity    Assist        Assist Level: Supervision/Verbal cueing   Wheelchair 150 feet activity     Assist      Assist Level: Supervision/Verbal cueing   Blood pressure 118/73, pulse 89, temperature 98 F (36.7 C), temperature source Oral, resp. rate 18, height 5\' 4"  (1.626 m), weight 96.7 kg, SpO2 100 %.    Medical Problem List and Plan: 1. Functional deficits secondary to polytrauma              -patient may shower             -ELOS/Goals: 10-14 days MinA/S             HFU scheduled in June  -transition to Eliqus for 21 days. Appreciate pharmacy assistance. Copay will be $60. Discussed this will continue until ortho clearance.   Con't CIR_ PT and OT- family training completed yesterday  2. Insomnia: continue amitriptyline to 25mg  HS 3. Postoperative pain: continue Tylenol, OxyContin 10 to 15 mg, decrease Robaxin to 750mg  q6H while awake due to hypotension, Neurontin. Recommended icing for edema and pain control. Discussed this regimen with patient. Continue amitriptyline  2/12- pain "adequate"- but not great- con't regimen 4. Tearful regarding pain/current condition: add amitirptyline as above which will help with pain and situational depression. Continue Amitriptyline.  5. Neuropsych: This patient is capable of making decisions on her own behalf. 6. Skin/Wound Care: Routine skin care checks --Monitor scalp hematoma  --Monitor surgical incisions --Keep right lower extremity dressing intact until follow-up Continue ortho follow-up --LUE dressing change as needed. May get incision wet. 7. Fluids/Electrolytes/Nutrition: Routine ins and outs and follow-up chemistries 8.  Right femur fracture-status post ORIF>>NWB. Requested ortho follow-up to answer some of her orthopedic questions             --LLE Venous duplex Doppler negative for clot.  9.  Right midfoot dislocation-status post CRPF, short leg splint>>NWB             -- RLE Venous duplex Doppler to rule out DVT 10.  Left humeral fracture-status post ORIF>>WBAT 11: Left radial nerve palsy-sling for comfort, wrist cock-up splint. Discussed prognosis of nerve recovery.  12.  Multiple bilateral rib fractures-Multimodal pain control             -- Pulmonary toilet 13.  Acute blood loss anemia status post 1 unit PRBCs             -- Monitor CBC 14: Vitamin D deficiency: Level 19 on 12/15/21-change supplement to ergocalciferol  50,000U once per week for 7 weeks.  15: Left medial malleolus fracture>>Cam boot with therapies. Discussed with patient. Nonweightbearing as per ortho recommendations.Discussed plan for repeat XR tomorrow.  16. Hypotension: d/c HCTZ. D/c Lisinopril.  2/12- BP controlled- con't regimen 17. L hand numbness/tingling: discussed timeline for recovery. 18. Obesity: BMI 36.59- provide dietary education    LOS: 11 days A FACE TO FACE EVALUATION WAS PERFORMED  Elizabeth Walsh 12/31/2021, 5:52 PM

## 2022-01-01 LAB — BASIC METABOLIC PANEL
Anion gap: 7 (ref 5–15)
BUN: 11 mg/dL (ref 6–20)
CO2: 25 mmol/L (ref 22–32)
Calcium: 8.4 mg/dL — ABNORMAL LOW (ref 8.9–10.3)
Chloride: 109 mmol/L (ref 98–111)
Creatinine, Ser: 0.6 mg/dL (ref 0.44–1.00)
GFR, Estimated: >60 mL/min (ref >60)
Glucose, Bld: 104 mg/dL — ABNORMAL HIGH (ref 70–99)
Potassium: 4 mmol/L (ref 3.5–5.1)
Sodium: 141 mmol/L (ref 135–145)

## 2022-01-01 LAB — CBC
HCT: 27 % — ABNORMAL LOW (ref 36.0–46.0)
Hemoglobin: 8.4 g/dL — ABNORMAL LOW (ref 12.0–15.0)
MCH: 30.5 pg (ref 26.0–34.0)
MCHC: 31.1 g/dL (ref 30.0–36.0)
MCV: 98.2 fL (ref 80.0–100.0)
Platelets: 403 10*3/uL — ABNORMAL HIGH (ref 150–400)
RBC: 2.75 MIL/uL — ABNORMAL LOW (ref 3.87–5.11)
RDW: 15.1 % (ref 11.5–15.5)
WBC: 5.3 10*3/uL (ref 4.0–10.5)
nRBC: 0 % (ref 0.0–0.2)

## 2022-01-01 MED ORDER — MAGNESIUM HYDROXIDE 400 MG/5ML PO SUSP
30.0000 mL | Freq: Every day | ORAL | Status: DC | PRN
  Administered 2022-01-01: 30 mL via ORAL
  Filled 2022-01-01: qty 30

## 2022-01-01 MED ORDER — LISINOPRIL 2.5 MG PO TABS: 2.5000 mg | ORAL_TABLET | Freq: Every day | ORAL | Status: DC | PRN

## 2022-01-01 NOTE — Progress Notes (Signed)
PROGRESS NOTE   Subjective/Complaints: C/o constipation: milk of mag daily prn ordered.  Appreciate ortho eval today Sleeping well Asks about blood pressure medication   ROS:   Pt denies SOB, abd pain, CP and vision changes, +constipation   Objective:   No results found. Recent Labs    01/01/22 0537  WBC 5.3  HGB 8.4*  HCT 27.0*  PLT 403*    Recent Labs    01/01/22 0537  NA 141  K 4.0  CL 109  CO2 25  GLUCOSE 104*  BUN 11  CREATININE 0.60  CALCIUM 8.4*     Intake/Output Summary (Last 24 hours) at 01/01/2022 0956 Last data filed at 12/31/2021 1315 Gross per 24 hour  Intake 240 ml  Output 300 ml  Net -60 ml         Physical Exam: Vital Signs Blood pressure 117/76, pulse 87, temperature 99.2 F (37.3 C), temperature source Oral, resp. rate 18, height 5\' 4"  (1.626 m), weight 96.7 kg, SpO2 97 %.   General: awake, alert, appropriate, NAD HENT: conjugate gaze; oropharynx moist CV: regular rate; no JVD Pulmonary: CTA B/L; no W/R/R- good air movement GI: soft, NT, ND, (+)BS Psychiatric: appropriate Neurological: Ox3 Skin: intact Neurological: Ox3 Ext: +edema- but mild of R toes- foot warm- no change Skin: post-surgical incisions C/D/I- esp notable on L humeral incision Neuro: Aox3 Musculoskeletal: LLE medial malleolus tender to palpation with bruising and swelling. No calf TTP.  RLE splint in place.  LUE sutures C/D/I- flicker of thumb extension Left ankle with some increased swelling.  Hand strength improving   Assessment/Plan: 1. Functional deficits which require 3+ hours per day of interdisciplinary therapy in a comprehensive inpatient rehab setting. Physiatrist is providing close team supervision and 24 hour management of active medical problems listed below. Physiatrist and rehab team continue to assess barriers to discharge/monitor patient progress toward functional and medical  goals  Care Tool:  Bathing    Body parts bathed by patient: Left arm, Chest, Abdomen, Face   Body parts bathed by helper: Right arm     Bathing assist Assist Level: Minimal Assistance - Patient > 75%     Upper Body Dressing/Undressing Upper body dressing   What is the patient wearing?: Pull over shirt    Upper body assist Assist Level: Moderate Assistance - Patient 50 - 74%    Lower Body Dressing/Undressing Lower body dressing      What is the patient wearing?: Pants     Lower body assist Assist for lower body dressing: Minimal Assistance - Patient > 75%     Toileting Toileting    Toileting assist Assist for toileting: Moderate Assistance - Patient 50 - 74%     Transfers Chair/bed transfer  Transfers assist     Chair/bed transfer assist level: Supervision/Verbal cueing Chair/bed transfer assistive device: Sliding board   Locomotion Ambulation   Ambulation assist   Ambulation activity did not occur: Safety/medical concerns          Walk 10 feet activity   Assist  Walk 10 feet activity did not occur: Safety/medical concerns        Walk 50 feet activity   Assist  Walk 50 feet with 2 turns activity did not occur: Safety/medical concerns         Walk 150 feet activity   Assist Walk 150 feet activity did not occur: Safety/medical concerns         Walk 10 feet on uneven surface  activity   Assist Walk 10 feet on uneven surfaces activity did not occur: Safety/medical concerns         Wheelchair     Assist Is the patient using a wheelchair?: Yes Type of Wheelchair: Manual    Wheelchair assist level: Set up assist, Supervision/Verbal cueing Max wheelchair distance: 56ft in room    Wheelchair 50 feet with 2 turns activity    Assist        Assist Level: Supervision/Verbal cueing   Wheelchair 150 feet activity     Assist      Assist Level: Supervision/Verbal cueing   Blood pressure 117/76, pulse 87,  temperature 99.2 F (37.3 C), temperature source Oral, resp. rate 18, height 5\' 4"  (1.626 m), weight 96.7 kg, SpO2 97 %.    Medical Problem List and Plan: 1. Functional deficits secondary to polytrauma             -patient may shower             -ELOS/Goals: 10-14 days MinA/S             HFU scheduled in June  -transition to [facility] for 21 days. Appreciate pharmacy assistance. Copay will be $60. Discussed this will continue until ortho clearance.   Con't CIR_ PT and OT- family training completed yesterday   Discussed d/c plan for tomorrow 2. Insomnia: continue amitriptyline to 25mg  HS 3. Postoperative pain: continue Tylenol, OxyContin 10 to 15 mg, decrease Robaxin to 750mg  q6H while awake due to hypotension, Neurontin. Recommended icing for edema and pain control. Discussed this regimen with patient. Continue amitriptyline  2/12- pain "adequate"- but not great- con't regimen 4. Tearful regarding pain/current condition: add amitirptyline as above which will help with pain and situational depression. Continue Amitriptyline.  5. Neuropsych: This patient is capable of making decisions on her own behalf. 6. Skin/Wound Care: Routine skin care checks --Monitor scalp hematoma  --Monitor surgical incisions --Keep right lower extremity dressing intact until follow-up Continue ortho follow-up --LUE dressing change as needed. May get incision wet. 7. Fluids/Electrolytes/Nutrition: Routine ins and outs and follow-up chemistries 8.  Right femur fracture-status post ORIF>>NWB. Requested ortho follow-up to answer some of her orthopedic questions             --LLE Venous duplex Doppler negative for clot.  9.  Right midfoot dislocation-status post CRPF, short leg splint>>NWB             -- RLE Venous duplex Doppler to rule out DVT 10.  Left humeral fracture-status post ORIF>>WBAT 11: Left radial nerve palsy-sling for comfort, wrist cock-up splint. Discussed prognosis of nerve recovery.  12.  Multiple  bilateral rib fractures-Multimodal pain control             -- Pulmonary toilet 13.  Acute blood loss anemia status post 1 unit PRBCs             -- Monitor CBC 14: Vitamin D deficiency: Level 19 on 12/15/21-change supplement to ergocalciferol 50,000U once per week for 7 weeks.  15: Left medial malleolus fracture>>Cam boot with therapies. Discussed with patient. Nonweightbearing as per ortho recommendations.Discussed plan for repeat XR tomorrow.  16. Hypotension: d/c HCTZ. Add lisinopril 2.5mg   daily prn for SBP>150: please send script for her at home 17. L hand numbness/tingling: discussed timeline for recovery. 18. Obesity: BMI 36.59- provide dietary education  19. Constipation: add milk of mag daily prn   LOS: 12 days A FACE TO FACE EVALUATION WAS PERFORMED  Drema Pry Elleni Mozingo 01/01/2022, 9:56 AM

## 2022-01-01 NOTE — Progress Notes (Signed)
Patent denies discomfort at this time, no acute distress.Son at bedside answers questions regarding medication and prn concerns. Anticipates discharge tomorrow
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
## 2022-01-01 NOTE — Progress Notes (Signed)
Orthopaedic Trauma Progress Note  SUBJECTIVE: Doing okay today. Just woke up, is not sure what her therapy schedule is today. Feels like her radial nerve is recovering a little bit. Is nervous but also excited to go home tomorrow. Son at bedside  OBJECTIVE: General: Sitting on EOB. NAD RLE: Short leg splint in place. Incisions over hip and thigh CDI. Able to wiggle toes. Skin warm and dry. Endorses sensation over toes LLE: Cam boot not currently in place.  Wiggles toes.  Skin warm and dry.  Compartments soft and compressible. LUE: Wrist brace in place.  Incision clean, dry, intact.  Tenderness about the upper arm as expected.  Unable to actively extend the wrist. Has a flicker of extension in the thumb.  Endorses sensation about the upper arm, elbow, and hand.  Fingers warm well-perfused, brisk cap refill  IMAGING: Repeat imaging on 12/29/21 stable.   ASSESSMENT: Elizabeth Walsh is a 56 y.o. female s/p ORIF LEFT HUMERUS by Dr. Jena Gauss 12/15/21 CLOSED REDUCTION PERCUTANEOUS PINNING RIGHT FOOT  Dr. Jena Gauss 12/15/21 IRRIGATION & DEBRIDEMENT WITH IMN RIGHT FEMUR by Dr. Blanchie Dessert 12/13/21   PLAN: Weightbearing:  - RLE: NWB - LLE: WB for transfers, otherwise no more than 50% PWB for mobility - LUE: WBAT Incisional and dressing care:  - RLE: splint left in place - LUE: change PRN Showering: Ok to show, keep splint dry. LUE incision may get wet Orthopedic device(s):  - RLE: short leg splint - LLE: CAM boot - LUE: sling PRN for comfort. Radial nerve palsy splint  Pain management: continue current regimen VTE prophylaxis: Lovenox, SCDs Impediments to Fracture Healing: Vitamin D level 19, continue D3 supplementation. Dispo: Continue care per CIR. D/C recommendation: - Continue Eliquis x21 days at discharge - Continue D3 supplementation x90 days - Plan to maintain RLE splint until follow-up  Follow - up plan: outpatient follow-up with Dr. Jena Gauss 2 weeks after discharge for repeat x-rays, wound  check, RLE splint and pin removal  Contact information:  Truitt Merle MD, Thyra Breed PA-C. After hours and holidays please check [email or web address] for group call information for [facility]   Thompson Caul, PA-C [phone] (office) [email or web address]

## 2022-01-01 NOTE — Progress Notes (Signed)
Physical Therapy Discharge Summary  Patient Details  Name: Elizabeth Walsh MRN: [id number] Date of Birth: 28-May-1966  Today's Date: 01/01/2022 PT Individual Time: 5597-4163 PT Individual Time Calculation (min): 56 min   Patient has met 6 of 6 long term goals due to improved activity tolerance, improved balance, improved postural control, increased strength, increased range of motion, decreased pain, ability to compensate for deficits, functional use of  left upper extremity and left lower extremity, improved awareness, and improved coordination. Patient to discharge at a wheelchair level Supervision. Patient's care partner is independent to provide the necessary physical assistance at discharge. Pt's family attended family education training on 2/11 and verbalized and demonstrated confidence with all tasks to ensure safe discharge home.   All goals met   Recommendation:  Patient will benefit from ongoing skilled PT services in outpatient setting to continue to advance safe functional mobility, address ongoing impairments in transfers, generalized strengthening/ROM/endurance, dynamic standing balance/coordination, gait training, and to minimize fall risk.  Equipment: 20x18 manual WC with bilateral elevating legrests, slideboard  Reasons for discharge: treatment goals met  Patient/family agrees with progress made and goals achieved: Yes  Today's Interventions: Received pt sitting in WC on phone with work/benefits. Therapist assisted with obtaining phone and fax numbers for Dr. Doreatha Martin and explaining exactly what procedures she had done. Pt agreeable to PT treatment, and reported pain 3-4/10 in LUE (premedicated). Session with emphasis on discharge planning, functional mobility, generalized strengthening, WC mobility, and improved activity tolerance. Spent increased time performing sensation and manual muscle testing and discussing expectations for OPPT. Pt then performed WC mobility >186f using  BUE and LLE with supervision with emphasis on cardiovascular endurance, turns, and navigating tight/narrow spaces. Pt required multiple rest breaks throughout due to fatigue. Pt/family with multiple questions regarding healing timeline and recovery process. Returned to room and [facility]   PT Discharge Precautions/Restrictions Precautions Precautions: Fall Precaution Comments: CAM boot for LLE Required Braces or Orthoses: Sling;Other Brace Other Brace: LUE sling for comfort, wrist cock-up splint during day, brace for night time Restrictions Weight Bearing Restrictions: Yes LUE Weight Bearing: Weight bearing as tolerated RLE Weight Bearing: Non weight bearing LLE Weight Bearing: Weight bearing as tolerated LLE Partial Weight Bearing Percentage or Pounds: 50% PWB Pain Interference Pain Interference Pain Effect on Sleep: 3. Frequently Pain Interference with Therapy Activities: 1. Rarely or not at all Pain Interference with Day-to-Day Activities: 1. Rarely or not at all Cognition Overall Cognitive Status: Within Functional Limits for tasks assessed Arousal/Alertness: Awake/alert Orientation Level: Oriented X4 Memory: Appears intact Awareness: Appears intact Problem Solving: Appears intact Safety/Judgment: Appears intact Sensation Sensation Light Touch: Appears Intact Hot/Cold: Impaired by gross assessment (LUE only) Proprioception: Appears Intact Stereognosis: Appears Intact Additional Comments: Pt reports sensation in L fingers has improved but still has numbness in L arm Coordination Gross Motor Movements are Fluid and Coordinated: Yes Fine Motor Movements are Fluid and Coordinated: No Coordination and Movement Description: grossly uncoordinated due to altered balance strategies 2/2 WB restrictions, pain, and generalized weakness Finger Nose Finger Test: WFL on RUE,  LUE has improved however still uncoordinated due to  decreased sensation/fine motor control from nerve injury Heel Shin Test: unable to perform bilaterally but dmeonstrates improvements in ROM and strength bilaterally Motor  Motor Motor: Within Functional Limits Motor - Skilled Clinical Observations: grossly uncoordinated due to altered balance strategies 2/2 WB restrictions, pain, and generalized weakness  Mobility Bed Mobility Bed Mobility: Rolling  Right;Rolling Left;Sit to Supine;Supine to Sit Rolling Right: Supervision/verbal cueing Rolling Left: Supervision/Verbal cueing Supine to Sit: Supervision/Verbal cueing Sit to Supine: Supervision/Verbal cueing Transfers Transfers: Lateral/Scoot Transfers Lateral/Scoot Transfers: Supervision/Verbal cueing Transfer (Assistive device): Other (Comment) (slideboard) Locomotion  Gait Ambulation: No (WB restrictions) Gait Gait: No Stairs / Additional Locomotion Stairs: No Wheelchair Mobility Wheelchair Mobility: Yes Wheelchair Assistance: Chartered loss adjuster: Both upper extremities;Left lower extremity Wheelchair Parts Management: Needs assistance Distance: >175f  Trunk/Postural Assessment  Cervical Assessment Cervical Assessment: Within Functional Limits Thoracic Assessment Thoracic Assessment: Within Functional Limits Lumbar Assessment Lumbar Assessment: Within Functional Limits Postural Control Postural Control: Within Functional Limits  Balance Balance Balance Assessed: Yes Static Sitting Balance Static Sitting - Balance Support: Feet supported;No upper extremity supported Static Sitting - Level of Assistance: 7: Independent Dynamic Sitting Balance Dynamic Sitting - Balance Support: Feet supported;No upper extremity supported Dynamic Sitting - Level of Assistance: 6: Modified independent (Device/Increase time) Extremity Assessment  RLE Assessment RLE Assessment: Exceptions to [facility] Strength Right Hip Flexion: 3+/5 Right Hip ABduction:  3+/5 Right Hip ADduction: 3+/5 Right Knee Flexion: 3+/5 Right Knee Extension: 3/5 LLE Assessment LLE Assessment: Exceptions to [facility] BryanLLE Strength Left Hip Flexion: 4-/5 Left Hip ABduction: 3+/5 Left Hip ADduction: 3+/5 Left Knee Flexion: 3+/5 Left Knee Extension: 3+/5  AAlfonse AlpersPT, DPT  01/01/2022, 7:45 AM

## 2022-01-01 NOTE — Progress Notes (Addendum)
Inpatient Rehabilitation Discharge Medication Review by a Pharmacist  A complete drug regimen review was completed for this patient to identify any potential clinically significant medication issues.  High Risk Drug Classes Is patient taking? Indication by Medication  Antipsychotic No   Anticoagulant Yes Apixaban - VTE prophylaxis  Antibiotic No   Opioid Yes OxyIR- post-op pain  Antiplatelet No   Hypoglycemics/insulin No   Vasoactive Medication Yes Lisinopril - hypertension  Chemotherapy No   Other Yes Protonix- GERD Gabapentin- neuropathic pain Robaxin- muscle spasms Elavil - insomnia     Type of Medication Issue Identified Description of Issue Recommendation(s)  Drug Interaction(s) (clinically significant)     Duplicate Therapy     Allergy     No Medication Administration End Date     Incorrect Dose     Additional Drug Therapy Needed     Significant med changes from prior encounter (inform family/care partners about these prior to discharge).    Other       Clinically significant medication issues were identified that warrant physician communication and completion of prescribed/recommended actions by midnight of the next day:  No  Name of provider notified for urgent issues identified:   Provider Method of Notification:     Pharmacist comments:   Time spent performing this drug regimen review (minutes):  30  Ulyses Southward, PharmD, [place], AAHIVP, CPP Infectious Disease Pharmacist 01/01/2022 2:01 PM

## 2022-01-01 NOTE — Progress Notes (Signed)
Occupational Therapy Session Note  Patient Details  Name: Elizabeth Walsh MRN: [id number] Date of Birth: 11/22/1965  Today's Date: 01/01/2022 OT Individual Time: 3428-7681 & 1572-6203 OT Individual Time Calculation (min): 72 min & 53 min   Short Term Goals: Week 2:  OT Short Term Goal 1 (Week 2): STG=LTG due to ELOS  Skilled Therapeutic Interventions/Progress Updates:  Session 1 Skilled OT intervention completed with focus on family education with daughter present, AROM of LUE, toilet transfers. Pt received seated on BSC in bathroom, with daughter present. Pt reporting discomfort in rectum from attempting BM with continent episode of BM during session. Note- pt with some blood present during wiping, with therapist educating on self-care tips for maintaining skin integrity and comfort during posterior pericare. Pt's daughter present to practice assisting pt at the supervision level from [facility] > w/c, with cues needed for using slideboard vs lateral scoot due to pt's limited hip clearance, with education provided about ease of this technique with slideboard. Therapist updated safety transfer sheet for nursing, clearing Elizabeth Walsh (the daughter) to assist pt with toilet transfers. While seated in w/c, pt participated in AROM of the LUE. Therapist issued pt a HEP handout to follow along for increasing the functional use of her non-dominant side once home. Exercises included 5 reps on L side only of each of the following:  Shoulder flexion, AROM Elbow flexion, AROM and AAROM Shoulder abduction, AROM Shoulder internal/external rotation, AROM  (With brace off) Wrist flexion/extension, AROM and AAROM Digit flexion/extension, AROM and AAROM  Pt required multimodal cues for form and technique, with modifications made to improve function such as placing in gravity eliminated position. Therapist took vides of each exercise on pt's phone per her request, as well as annotated the handout for improved form once  home. Pt left seated in w/c with chair alarm on, and all needs in reach and daughter in room at end of session.   Session 2 Skilled OT intervention completed with focus on d/c planning, home/kitchen management education. Pt received seated in w/c, agreeable to session with pt's son present. Pt/pt's son with questions about HEP given to pt earlier with review completed at start of session to address concerns. Transported pt with total A in w/c from room <> ADL kitchen for safety/accessibility education. Pt participated in discussion as well as demonstration of navigating through kitchen at the w/c level for simple meal prep tasks. Therapist educated on arranging items at reachable level, using caution with hot items due to impaired sensation in L digits 1-2. Pt was left seated in w/c with son present, and nurse at bedside to address pt's concerns about her surgical bandages, at direct care hand off.    Therapy Documentation Precautions:  Precautions Precautions: Fall Precaution Comments: CAM boot for LLE Required Braces or Orthoses: Sling, Other Brace Other Brace: LUE sling for comfort, wrist cock-up splint during day, brace for night time Restrictions Weight Bearing Restrictions: Yes LUE Weight Bearing: Weight bearing as tolerated RLE Weight Bearing: Non weight bearing LLE Weight Bearing: Weight bearing as tolerated LLE Partial Weight Bearing Percentage or Pounds:  (50%)  Pain: No c/o pain   Therapy/Group: Individual Therapy  Elizabeth Walsh E Elizabeth Walsh 01/01/2022, 7:38 AM

## 2022-01-01 NOTE — Progress Notes (Signed)
Occupational Therapy Discharge Summary  Patient Details  Name: Elizabeth Walsh MRN: [id number] Date of Birth: 06-23-1966   Patient has met 8 of 9 long term goals due to improved activity tolerance, improved balance, ability to compensate for deficits, functional use of  LEFT upper and LEFT lower extremity, improved attention, improved awareness, and improved coordination.  Patient to discharge at overall supervision level for functional transfers and min A level for LB self-care tasks.  Patient's son and daughter in law are both able to provide the necessary physical assistance at discharge and have completed family training with hands on practice with self-care tasks with pt.   Reasons goals not met: shower transfer goal not met due to pt refusal, as she reports she no longer wants to take full shower until cast/boot wearing is uplifted. Pt has completed multiple slide board transfers to different surfaces including BSC, at the supervision level. This OT is confident pt could complete at same level assist, however did not attempt actual trial during rehab therefore not met.  Recommendation:  Patient will benefit from ongoing skilled OT services in outpatient setting to continue to advance functional skills in the area of BADL, iADL, and improve her LUE function as her non-dominant side .  Equipment: Bariatric drop arm BSC  Reasons for discharge: treatment goals met  Patient/family agrees with progress made and goals achieved: Yes  OT Discharge Precautions/Restrictions  Precautions Precautions: Fall Precaution Comments: CAM boot for LLE Required Braces or Orthoses: Sling;Other Brace Other Brace: LUE sling for comfort, wrist cock-up splint during day, brace for night time Restrictions Weight Bearing Restrictions: Yes LUE Weight Bearing: Weight bearing as tolerated RLE Weight Bearing: Non weight bearing LLE Weight Bearing: Weight bearing as tolerated LLE Partial Weight Bearing Percentage  or Pounds: 50% PWB ADL ADL Eating: Modified independent Where Assessed-Eating: Wheelchair Grooming: Modified independent Where Assessed-Grooming: Wheelchair Upper Body Bathing: Modified independent Where Assessed-Upper Body Bathing: Wheelchair Lower Body Bathing: Modified independent Where Assessed-Lower Body Bathing: Edge of bed Upper Body Dressing: Setup Where Assessed-Upper Body Dressing: Wheelchair Lower Body Dressing: Supervision/safety Where Assessed-Lower Body Dressing: Edge of bed Toileting: Minimal assistance Where Assessed-Toileting: Bedside Commode Toilet Transfer: Close supervision Toilet Transfer Method: Theatre manager: Extra wide drop arm bedside commode Tub/Shower Transfer: Not assessed Social research officer, government: Not assessed Vision Baseline Vision/History: 0 No visual deficits Patient Visual Report: No change from baseline Vision Assessment?: No apparent visual deficits Perception  Perception: Within Functional Limits Praxis Praxis: Intact Cognition Overall Cognitive Status: Within Functional Limits for tasks assessed Arousal/Alertness: Awake/alert Orientation Level: Oriented X4 Year: 2023 Month: February Day of Week: Correct Memory: Appears intact Immediate Memory Recall: Sock;Blue;Bed Memory Recall Sock: Without Cue Memory Recall Blue: Without Cue Memory Recall Bed: Without Cue Awareness: Appears intact Problem Solving: Appears intact Safety/Judgment: Appears intact Sensation Sensation Light Touch: Appears Intact Hot/Cold: Impaired by gross assessment (LUE only) Proprioception: Appears Intact Stereognosis: Appears Intact Additional Comments: Per pt report, sensation has improved on LUE with increased light touch and feeling on digits 3-5, however digits 1-2 still with numbness that extends along the radial nerve distribution Coordination Gross Motor Movements are Fluid and Coordinated: Yes Fine Motor Movements are Fluid  and Coordinated: No (impaired on LUE) Finger Nose Finger Test: WFL on RUE,  LUE has improved however still uncoordinated due to decreased sensation/fine motor control from nerve injury Motor  Motor Motor: Within Functional Limits Motor - Skilled Clinical Observations: grossly uncoordinated due to altered balance strategies 2/2 WB restrictions, pain,  and generalized weakness Mobility  Bed Mobility Bed Mobility: Rolling Right;Rolling Left;Sit to Supine;Supine to Sit Rolling Right: Supervision/verbal cueing Rolling Left: Supervision/Verbal cueing Supine to Sit: Supervision/Verbal cueing Sit to Supine: Supervision/Verbal cueing  Trunk/Postural Assessment  Cervical Assessment Cervical Assessment: Within Functional Limits Thoracic Assessment Thoracic Assessment: Within Functional Limits Lumbar Assessment Lumbar Assessment: Within Functional Limits Postural Control Postural Control: Within Functional Limits  Balance Balance Balance Assessed: Yes Static Sitting Balance Static Sitting - Balance Support: Feet supported;No upper extremity supported Static Sitting - Level of Assistance: 7: Independent Dynamic Sitting Balance Dynamic Sitting - Balance Support: Feet supported;No upper extremity supported Dynamic Sitting - Level of Assistance: 6: Modified independent (Device/Increase time) Extremity/Trunk Assessment RUE Assessment RUE Assessment: Within Functional Limits LUE Assessment LUE Assessment: Exceptions to [facility] Active Range of Motion (AROM) Comments: shoulder flexion <90, elbow flexion <90, shoulder abduction <90, some AROM in digits/wrist distally General Strength Comments: Grip strength WFL, unable to assess formally due to weakness/pain   Dail Lerew E Cashe Gatt 01/01/2022, 7:44 AM

## 2022-01-02 MED ORDER — ASCORBIC ACID 1000 MG PO TABS: 1000.0000 mg | ORAL_TABLET | Freq: Every day | ORAL | 0 refills | Status: DC

## 2022-01-02 MED ORDER — ENSURE ENLIVE PO LIQD: 237.0000 mL | Freq: Two times a day (BID) | ORAL | 12 refills | Status: DC

## 2022-01-02 MED ORDER — APIXABAN 2.5 MG PO TABS: 2.5000 mg | ORAL_TABLET | Freq: Two times a day (BID) | ORAL | 0 refills | Status: DC

## 2022-01-02 MED ORDER — GABAPENTIN 400 MG PO CAPS: 400.0000 mg | ORAL_CAPSULE | Freq: Three times a day (TID) | ORAL | 0 refills | Status: DC

## 2022-01-02 MED ORDER — LISINOPRIL 2.5 MG PO TABS: 2.5000 mg | ORAL_TABLET | Freq: Every day | ORAL | 0 refills | Status: DC | PRN

## 2022-01-02 MED ORDER — CALCIUM CITRATE 950 (200 CA) MG PO TABS: 200.0000 mg | ORAL_TABLET | Freq: Two times a day (BID) | ORAL | 0 refills | Status: DC

## 2022-01-02 MED ORDER — POLYETHYLENE GLYCOL 3350 17 G PO PACK: 17.0000 g | PACK | Freq: Every day | ORAL | 0 refills | Status: DC | PRN

## 2022-01-02 MED ORDER — MAGNESIUM HYDROXIDE 400 MG/5ML PO SUSP: 30.0000 mL | Freq: Every day | ORAL | 0 refills | Status: DC | PRN

## 2022-01-02 MED ORDER — METHOCARBAMOL 750 MG PO TABS: 750.0000 mg | ORAL_TABLET | Freq: Four times a day (QID) | ORAL | 0 refills | Status: DC

## 2022-01-02 MED ORDER — AMITRIPTYLINE HCL 25 MG PO TABS: 25.0000 mg | ORAL_TABLET | Freq: Every day | ORAL | 0 refills | Status: DC

## 2022-01-02 MED ORDER — OXYCODONE HCL 10 MG PO TABS: 10.0000 mg | ORAL_TABLET | ORAL | 0 refills | Status: DC | PRN

## 2022-01-02 MED ORDER — DOCUSATE SODIUM 100 MG PO CAPS: 100.0000 mg | ORAL_CAPSULE | Freq: Two times a day (BID) | ORAL | 0 refills | Status: DC

## 2022-01-02 MED ORDER — POLYSACCHARIDE IRON COMPLEX 150 MG PO CAPS: 150.0000 mg | ORAL_CAPSULE | Freq: Two times a day (BID) | ORAL | 0 refills | Status: DC

## 2022-01-02 MED ORDER — PANTOPRAZOLE SODIUM 40 MG PO TBEC: 40.0000 mg | DELAYED_RELEASE_TABLET | Freq: Every day | ORAL | 0 refills | Status: DC

## 2022-01-02 MED ORDER — HYDROCORTISONE (PERIANAL) 2.5 % EX CREA: TOPICAL_CREAM | Freq: Two times a day (BID) | CUTANEOUS | 0 refills | Status: DC

## 2022-01-02 NOTE — Progress Notes (Signed)
PROGRESS NOTE   Subjective/Complaints: Had diarrhea with milk pf mag- asks about what she should do for constipation moving forward. Discussed high fiber diet, specific and general categories of food that can help   ROS:   Pt denies SOB, abd pain, CP and vision changes, +constipation, +postoperative pain   Objective:   No results found. Recent Labs    01/01/22 0537  WBC 5.3  HGB 8.4*  HCT 27.0*  PLT 403*    Recent Labs    01/01/22 0537  NA 141  K 4.0  CL 109  CO2 25  GLUCOSE 104*  BUN 11  CREATININE 0.60  CALCIUM 8.4*     Intake/Output Summary (Last 24 hours) at 01/02/2022 1101 Last data filed at 01/02/2022 0900 Gross per 24 hour  Intake 358 ml  Output --  Net 358 ml         Physical Exam: Vital Signs Blood pressure 130/78, pulse 83, temperature 99.4 F (37.4 C), temperature source Oral, resp. rate 18, height 5\' 4"  (1.626 m), weight 96.7 kg, SpO2 100 %. Gen: no distress, normal appearing HEENT: oral mucosa pink and moist, NCAT Cardio: Reg rate Chest: normal effort, normal rate of breathing Abd: soft, non-distended Ext: no edema Psych: pleasant, normal affect Skin: intact Neurological: Ox3 Ext: +edema- but mild of R toes- foot warm- no change Skin: post-surgical incisions C/D/I- esp notable on L humeral incision Neuro: Aox3 Musculoskeletal: LLE medial malleolus tender to palpation with bruising and swelling. No calf TTP.  RLE splint in place.  LUE sutures C/D/I- flicker of thumb extension Left ankle with some increased swelling.  Hand strength improving   Assessment/Plan: 1. Functional deficits which require 3+ hours per day of interdisciplinary therapy in a comprehensive inpatient rehab setting. Physiatrist is providing close team supervision and 24 hour management of active medical problems listed below. Physiatrist and rehab team continue to assess barriers to discharge/monitor patient  progress toward functional and medical goals  Care Tool:  Bathing    Body parts bathed by patient: Chest, Abdomen, Front perineal area, Buttocks, Right upper leg, Left upper leg, Face, Left arm, Right arm, Left lower leg   Body parts bathed by helper: Right arm Body parts n/a: Right lower leg (cast)   Bathing assist Assist Level: Supervision/Verbal cueing     Upper Body Dressing/Undressing Upper body dressing   What is the patient wearing?: Pull over shirt    Upper body assist Assist Level: Set up assist    Lower Body Dressing/Undressing Lower body dressing      What is the patient wearing?: Pants     Lower body assist Assist for lower body dressing: Supervision/Verbal cueing     Toileting Toileting    Toileting assist Assist for toileting: Minimal Assistance - Patient > 75%     Transfers Chair/bed transfer  Transfers assist     Chair/bed transfer assist level: Supervision/Verbal cueing Chair/bed transfer assistive device: Sliding board   Locomotion Ambulation   Ambulation assist   Ambulation activity did not occur: Safety/medical concerns (WB restrictions)          Walk 10 feet activity   Assist  Walk 10 feet activity did not  occur: Safety/medical concerns (WB restrictions)        Walk 50 feet activity   Assist Walk 50 feet with 2 turns activity did not occur: Safety/medical concerns (WB restrictions)         Walk 150 feet activity   Assist Walk 150 feet activity did not occur: Safety/medical concerns (WB restrictions)         Walk 10 feet on uneven surface  activity   Assist Walk 10 feet on uneven surfaces activity did not occur: Safety/medical concerns (WB restrictions)         Wheelchair     Assist Is the patient using a wheelchair?: Yes Type of Wheelchair: Manual    Wheelchair assist level: Supervision/Verbal cueing Max wheelchair distance: >151ft    Wheelchair 50 feet with 2 turns  activity    Assist        Assist Level: Supervision/Verbal cueing   Wheelchair 150 feet activity     Assist      Assist Level: Supervision/Verbal cueing   Blood pressure 130/78, pulse 83, temperature 99.4 F (37.4 C), temperature source Oral, resp. rate 18, height 5\' 4"  (1.626 m), weight 96.7 kg, SpO2 100 %.    Medical Problem List and Plan: 1. Functional deficits secondary to polytrauma             -patient may shower             -ELOS/Goals: 10-14 days MinA/S             HFU scheduled in June  -transition to [facility] for 21 days. Appreciate pharmacy assistance. Copay will be $60. Discussed this will continue until ortho clearance.   D/c home today 2. Insomnia: continue amitriptyline to 25mg  HS 3. Postoperative pain: continue Tylenol, OxyContin 10 to 15 mg, decrease Robaxin to 750mg  q6H while awake due to hypotension, Neurontin. Recommended icing for edema and pain control. Discussed this regimen with patient. Continue amitriptyline  2/12- pain "adequate"- but not great- con't regimen 4. Tearful regarding pain/current condition: add amitirptyline as above which will help with pain and situational depression. Continue Amitriptyline.  5. Neuropsych: This patient is capable of making decisions on her own behalf. 6. Skin/Wound Care: Routine skin care checks --Monitor scalp hematoma  --Monitor surgical incisions --Keep right lower extremity dressing intact until follow-up Continue ortho follow-up --LUE dressing change as needed. May get incision wet. 7. Fluids/Electrolytes/Nutrition: Routine ins and outs and follow-up chemistries 8.  Right femur fracture-status post ORIF>>NWB. Requested ortho follow-up to answer some of her orthopedic questions             --LLE Venous duplex Doppler negative for clot.  9.  Right midfoot dislocation-status post CRPF, short leg splint>>NWB             -- RLE Venous duplex Doppler to rule out DVT 10.  Left humeral fracture-status post  ORIF>>WBAT 11: Left radial nerve palsy-sling for comfort, wrist cock-up splint. Discussed prognosis of nerve recovery.  12.  Multiple bilateral rib fractures-Multimodal pain control             -- Pulmonary toilet 13.  Acute blood loss anemia status post 1 unit PRBCs             -- Monitor CBC 14: Vitamin D deficiency: Level 19 on 12/15/21-change supplement to ergocalciferol 50,000U once per week for 7 weeks.  15: Left medial malleolus fracture>>Cam boot with therapies. Discussed with patient. Nonweightbearing as per ortho recommendations. Discussed outpatient follow up with ortho  16. Hypotension: d/c HCTZ. Add lisinopril 2.5mg  daily prn for SBP>150: please send script for her at home- discussed with patient and family 24. L hand numbness/tingling: discussed timeline for recovery. 18. Obesity: BMI 36.59- provide dietary education  19. Constipation: add milk of mag daily prn. Discussed high fiber foods   >30 minutes spent in discharge of patient including review of medications and follow-up appointments, physical examination, and in answering all patient's questions  LOS: 13 days A FACE TO FACE EVALUATION WAS PERFORMED  Elizabeth Walsh P Susana Duell 01/02/2022, 11:01 AM

## 2022-01-02 NOTE — Progress Notes (Signed)
Pt discharged at this time. All personal belongings and equipment packed and sent with pt. Pt transported to vehicle via wheelchair accompanied by staff and son.

## 2022-01-02 NOTE — Progress Notes (Signed)
Inpatient Rehabilitation Care Coordinator Discharge Note   Patient Details  Name: Elizabeth Walsh MRN: [id number] Date of Birth: 10/24/1966   Discharge location: Home  Length of Stay: 13 Days  Discharge activity level: Sup/Cga  Home/community participation: family  Patient response [id number] Literacy - How often do you need to have someone help you when you read instructions, pamphlets, or other written material from your doctor or pharmacy?: Never  Patient response [id number] Isolation - How often do you feel lonely or isolated from those around you?: Never  Services provided included: MD, RD, PT, OT, SLP, RN, TR, CM, Pharmacy, Neuropsych  Financial Services:  Charity fundraiser Utilized: [place]  Choices offered to/list presented to: n/a  Follow-up services arranged:  Other (Comment)           Patient response to transportation need: Is the patient able to respond to transportation needs?: Yes In the past 12 months, has lack of transportation kept you from medical appointments or from getting medications?: No In the past 12 months, has lack of transportation kept you from meetings, work, or from getting things needed for daily living?: No    Comments (or additional information):  Patient/Family verbalized understanding of follow-up arrangements:  Yes  Individual responsible for coordination of the follow-up plan: patient  Confirmed correct DME delivered: Dyanne Iha 01/02/2022    Dyanne Iha

## 2022-01-04 ENCOUNTER — Telehealth: Payer: Self-pay

## 2022-01-04 NOTE — Telephone Encounter (Signed)
Transitional Care call--Patient    Are you/is patient experiencing any problems since coming home? No Are there any questions regarding any aspect of care? No Are there any questions regarding medications administration/dosing? No Are meds being taken as prescribed? Yes Patient should review meds with caller to confirm Have there been any falls? No Has Home Health been to the house and/or have they contacted you? No need If not, have you tried to contact them? Can we help you contact them? Are bowels and bladder emptying properly?  Yes Are there any unexpected incontinence issues? No If applicable, is patient following bowel/bladder programs? Any fevers, problems with breathing, unexpected pain? No Are there any skin problems or new areas of breakdown? No Has the patient/family member arranged specialty MD follow up (ie cardiology/neurology/renal/surgical/etc)?  Yes Can we help arrange? Does the patient need any other services or support that we can help arrange? No Are caregivers following through as expected in assisting the patient? Yes Has the patient quit smoking, drinking alcohol, or using drugs as recommended? Yes  Appointment time 10:00 am, arrive time 9:40 am on 04/23/22 with Dr. Carlis Abbott [address] [phone]

## 2022-01-29 ENCOUNTER — Other Ambulatory Visit: Payer: Self-pay | Admitting: Physical Medicine and Rehabilitation

## 2022-01-29 ENCOUNTER — Other Ambulatory Visit: Payer: Self-pay | Admitting: Physician Assistant

## 2022-01-30 ENCOUNTER — Other Ambulatory Visit: Payer: Self-pay | Admitting: Physician Assistant
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
## 2022-02-01 ENCOUNTER — Encounter: Payer: Self-pay | Admitting: Physical Therapy

## 2022-02-01 ENCOUNTER — Ambulatory Visit: Payer: 59 | Attending: Student | Admitting: Physical Therapy

## 2022-02-01 ENCOUNTER — Other Ambulatory Visit: Payer: Self-pay

## 2022-02-01 DIAGNOSIS — R600 Localized edema: Secondary | ICD-10-CM | POA: Diagnosis present

## 2022-02-01 DIAGNOSIS — T148XXA Other injury of unspecified body region, initial encounter: Secondary | ICD-10-CM | POA: Diagnosis present

## 2022-02-01 DIAGNOSIS — M25571 Pain in right ankle and joints of right foot: Secondary | ICD-10-CM | POA: Insufficient documentation

## 2022-02-01 DIAGNOSIS — R2689 Other abnormalities of gait and mobility: Secondary | ICD-10-CM | POA: Diagnosis present

## 2022-02-01 DIAGNOSIS — R2681 Unsteadiness on feet: Secondary | ICD-10-CM | POA: Insufficient documentation

## 2022-02-01 DIAGNOSIS — S72351B Displaced comminuted fracture of shaft of right femur, initial encounter for open fracture type I or II: Secondary | ICD-10-CM | POA: Insufficient documentation

## 2022-02-01 DIAGNOSIS — M6281 Muscle weakness (generalized): Secondary | ICD-10-CM | POA: Insufficient documentation

## 2022-02-01 DIAGNOSIS — S9304XA Dislocation of right ankle joint, initial encounter: Secondary | ICD-10-CM | POA: Insufficient documentation

## 2022-02-01 DIAGNOSIS — T1490XA Injury, unspecified, initial encounter: Secondary | ICD-10-CM | POA: Insufficient documentation

## 2022-02-04 ENCOUNTER — Other Ambulatory Visit: Payer: Self-pay | Admitting: Physician Assistant
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
## 2022-02-08 ENCOUNTER — Other Ambulatory Visit: Payer: Self-pay

## 2022-02-08 ENCOUNTER — Ambulatory Visit: Payer: 59

## 2022-02-08 DIAGNOSIS — M6281 Muscle weakness (generalized): Secondary | ICD-10-CM

## 2022-02-08 DIAGNOSIS — R600 Localized edema: Secondary | ICD-10-CM

## 2022-02-08 DIAGNOSIS — R2689 Other abnormalities of gait and mobility: Secondary | ICD-10-CM

## 2022-02-08 DIAGNOSIS — R2681 Unsteadiness on feet: Secondary | ICD-10-CM

## 2022-02-08 DIAGNOSIS — M25571 Pain in right ankle and joints of right foot: Secondary | ICD-10-CM

## 2022-02-10 ENCOUNTER — Encounter: Payer: Self-pay | Admitting: Physical Therapy

## 2022-02-10 ENCOUNTER — Ambulatory Visit: Payer: 59 | Admitting: Physical Therapy

## 2022-02-10 ENCOUNTER — Other Ambulatory Visit: Payer: Self-pay

## 2022-02-10 DIAGNOSIS — M25571 Pain in right ankle and joints of right foot: Secondary | ICD-10-CM

## 2022-02-10 DIAGNOSIS — M6281 Muscle weakness (generalized): Secondary | ICD-10-CM | POA: Diagnosis not present

## 2022-02-10 DIAGNOSIS — R600 Localized edema: Secondary | ICD-10-CM

## 2022-02-10 DIAGNOSIS — R2689 Other abnormalities of gait and mobility: Secondary | ICD-10-CM

## 2022-02-10 DIAGNOSIS — R2681 Unsteadiness on feet: Secondary | ICD-10-CM
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
## 2022-02-13 ENCOUNTER — Ambulatory Visit: Payer: 59

## 2022-02-13 ENCOUNTER — Other Ambulatory Visit: Payer: Self-pay

## 2022-02-13 DIAGNOSIS — M6281 Muscle weakness (generalized): Secondary | ICD-10-CM | POA: Diagnosis not present

## 2022-02-13 DIAGNOSIS — R2689 Other abnormalities of gait and mobility: Secondary | ICD-10-CM

## 2022-02-13 DIAGNOSIS — S9304XA Dislocation of right ankle joint, initial encounter: Secondary | ICD-10-CM

## 2022-02-13 DIAGNOSIS — T1490XA Injury, unspecified, initial encounter: Secondary | ICD-10-CM

## 2022-02-13 DIAGNOSIS — S72351B Displaced comminuted fracture of shaft of right femur, initial encounter for open fracture type I or II: Secondary | ICD-10-CM

## 2022-02-13 DIAGNOSIS — R2681 Unsteadiness on feet: Secondary | ICD-10-CM

## 2022-02-13 DIAGNOSIS — T148XXA Other injury of unspecified body region, initial encounter: Secondary | ICD-10-CM

## 2022-02-13 DIAGNOSIS — R600 Localized edema: Secondary | ICD-10-CM

## 2022-02-13 DIAGNOSIS — M25571 Pain in right ankle and joints of right foot: Secondary | ICD-10-CM
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
## 2022-02-16 ENCOUNTER — Ambulatory Visit: Payer: 59 | Admitting: Physical Therapy

## 2022-02-16 DIAGNOSIS — R2681 Unsteadiness on feet: Secondary | ICD-10-CM

## 2022-02-16 DIAGNOSIS — M6281 Muscle weakness (generalized): Secondary | ICD-10-CM | POA: Diagnosis not present

## 2022-02-16 DIAGNOSIS — T148XXA Other injury of unspecified body region, initial encounter: Secondary | ICD-10-CM

## 2022-02-16 DIAGNOSIS — M25571 Pain in right ankle and joints of right foot: Secondary | ICD-10-CM

## 2022-02-16 DIAGNOSIS — R600 Localized edema: Secondary | ICD-10-CM

## 2022-02-16 DIAGNOSIS — R2689 Other abnormalities of gait and mobility: Secondary | ICD-10-CM
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
## 2022-02-17 ENCOUNTER — Other Ambulatory Visit: Payer: Self-pay | Admitting: Physician Assistant

## 2022-02-20 ENCOUNTER — Ambulatory Visit: Payer: 59 | Attending: Student | Admitting: Physical Therapy

## 2022-02-20 ENCOUNTER — Encounter: Payer: Self-pay | Admitting: Physical Therapy

## 2022-02-20 DIAGNOSIS — R600 Localized edema: Secondary | ICD-10-CM | POA: Diagnosis present

## 2022-02-20 DIAGNOSIS — M6281 Muscle weakness (generalized): Secondary | ICD-10-CM | POA: Diagnosis present

## 2022-02-20 DIAGNOSIS — M25571 Pain in right ankle and joints of right foot: Secondary | ICD-10-CM | POA: Diagnosis present

## 2022-02-20 DIAGNOSIS — R2681 Unsteadiness on feet: Secondary | ICD-10-CM | POA: Diagnosis present

## 2022-02-20 DIAGNOSIS — R2689 Other abnormalities of gait and mobility: Secondary | ICD-10-CM | POA: Diagnosis present
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
## 2022-02-22 ENCOUNTER — Encounter: Payer: Self-pay | Admitting: Physical Therapy

## 2022-02-22 ENCOUNTER — Ambulatory Visit: Payer: 59 | Admitting: Physical Therapy

## 2022-02-22 DIAGNOSIS — M6281 Muscle weakness (generalized): Secondary | ICD-10-CM | POA: Diagnosis not present

## 2022-02-22 DIAGNOSIS — R2689 Other abnormalities of gait and mobility: Secondary | ICD-10-CM

## 2022-02-22 DIAGNOSIS — M25571 Pain in right ankle and joints of right foot: Secondary | ICD-10-CM

## 2022-02-22 DIAGNOSIS — R2681 Unsteadiness on feet: Secondary | ICD-10-CM

## 2022-02-22 DIAGNOSIS — R600 Localized edema: Secondary | ICD-10-CM
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
## 2022-03-06 ENCOUNTER — Ambulatory Visit: Payer: 59

## 2022-03-06 DIAGNOSIS — M6281 Muscle weakness (generalized): Secondary | ICD-10-CM

## 2022-03-06 DIAGNOSIS — R2681 Unsteadiness on feet: Secondary | ICD-10-CM

## 2022-03-06 DIAGNOSIS — R600 Localized edema: Secondary | ICD-10-CM

## 2022-03-06 DIAGNOSIS — R2689 Other abnormalities of gait and mobility: Secondary | ICD-10-CM

## 2022-03-06 DIAGNOSIS — M25571 Pain in right ankle and joints of right foot: Secondary | ICD-10-CM
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
## 2022-03-08 ENCOUNTER — Ambulatory Visit: Payer: 59 | Admitting: Physical Therapy

## 2022-03-08 ENCOUNTER — Encounter: Payer: Self-pay | Admitting: Physical Therapy

## 2022-03-08 DIAGNOSIS — R2689 Other abnormalities of gait and mobility: Secondary | ICD-10-CM

## 2022-03-08 DIAGNOSIS — M6281 Muscle weakness (generalized): Secondary | ICD-10-CM

## 2022-03-08 DIAGNOSIS — R2681 Unsteadiness on feet: Secondary | ICD-10-CM

## 2022-03-08 DIAGNOSIS — R600 Localized edema: Secondary | ICD-10-CM
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
## 2022-03-13 ENCOUNTER — Encounter: Payer: Self-pay | Admitting: Physical Therapy

## 2022-03-13 ENCOUNTER — Ambulatory Visit: Payer: 59 | Admitting: Physical Therapy

## 2022-03-13 DIAGNOSIS — M6281 Muscle weakness (generalized): Secondary | ICD-10-CM | POA: Diagnosis not present

## 2022-03-13 DIAGNOSIS — R2689 Other abnormalities of gait and mobility: Secondary | ICD-10-CM

## 2022-03-13 DIAGNOSIS — R600 Localized edema: Secondary | ICD-10-CM

## 2022-03-13 DIAGNOSIS — M25571 Pain in right ankle and joints of right foot: Secondary | ICD-10-CM

## 2022-03-13 DIAGNOSIS — R2681 Unsteadiness on feet: Secondary | ICD-10-CM
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
## 2022-03-15 ENCOUNTER — Ambulatory Visit: Payer: 59

## 2022-03-15 DIAGNOSIS — R600 Localized edema: Secondary | ICD-10-CM

## 2022-03-15 DIAGNOSIS — M6281 Muscle weakness (generalized): Secondary | ICD-10-CM

## 2022-03-15 DIAGNOSIS — M25571 Pain in right ankle and joints of right foot: Secondary | ICD-10-CM

## 2022-03-15 DIAGNOSIS — R2689 Other abnormalities of gait and mobility: Secondary | ICD-10-CM

## 2022-03-15 DIAGNOSIS — R2681 Unsteadiness on feet: Secondary | ICD-10-CM
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
## 2022-03-20 ENCOUNTER — Ambulatory Visit: Payer: 59 | Attending: Student

## 2022-03-20 DIAGNOSIS — M25571 Pain in right ankle and joints of right foot: Secondary | ICD-10-CM | POA: Insufficient documentation

## 2022-03-20 DIAGNOSIS — R2681 Unsteadiness on feet: Secondary | ICD-10-CM | POA: Insufficient documentation

## 2022-03-20 DIAGNOSIS — M6281 Muscle weakness (generalized): Secondary | ICD-10-CM | POA: Insufficient documentation

## 2022-03-20 DIAGNOSIS — R600 Localized edema: Secondary | ICD-10-CM | POA: Insufficient documentation

## 2022-03-20 DIAGNOSIS — R2689 Other abnormalities of gait and mobility: Secondary | ICD-10-CM | POA: Insufficient documentation

## 2022-03-22 ENCOUNTER — Ambulatory Visit: Payer: 59
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
## 2022-03-27 ENCOUNTER — Ambulatory Visit: Payer: 59

## 2022-03-27 DIAGNOSIS — R2681 Unsteadiness on feet: Secondary | ICD-10-CM

## 2022-03-27 DIAGNOSIS — M25571 Pain in right ankle and joints of right foot: Secondary | ICD-10-CM

## 2022-03-27 DIAGNOSIS — M6281 Muscle weakness (generalized): Secondary | ICD-10-CM

## 2022-03-27 DIAGNOSIS — R2689 Other abnormalities of gait and mobility: Secondary | ICD-10-CM | POA: Diagnosis present

## 2022-03-27 DIAGNOSIS — R600 Localized edema: Secondary | ICD-10-CM
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
## 2022-03-29 ENCOUNTER — Ambulatory Visit: Payer: 59

## 2022-03-29 DIAGNOSIS — R2681 Unsteadiness on feet: Secondary | ICD-10-CM

## 2022-03-29 DIAGNOSIS — M6281 Muscle weakness (generalized): Secondary | ICD-10-CM | POA: Diagnosis not present

## 2022-03-29 DIAGNOSIS — R600 Localized edema: Secondary | ICD-10-CM

## 2022-03-29 DIAGNOSIS — R2689 Other abnormalities of gait and mobility: Secondary | ICD-10-CM

## 2022-03-29 DIAGNOSIS — M25571 Pain in right ankle and joints of right foot: Secondary | ICD-10-CM
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
## 2022-04-03 ENCOUNTER — Ambulatory Visit: Payer: 59

## 2022-04-03 DIAGNOSIS — R600 Localized edema: Secondary | ICD-10-CM

## 2022-04-03 DIAGNOSIS — M6281 Muscle weakness (generalized): Secondary | ICD-10-CM

## 2022-04-03 DIAGNOSIS — R2681 Unsteadiness on feet: Secondary | ICD-10-CM

## 2022-04-03 DIAGNOSIS — R2689 Other abnormalities of gait and mobility: Secondary | ICD-10-CM

## 2022-04-03 DIAGNOSIS — M25571 Pain in right ankle and joints of right foot: Secondary | ICD-10-CM
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
## 2022-04-05 ENCOUNTER — Ambulatory Visit: Payer: 59

## 2022-04-05 DIAGNOSIS — M6281 Muscle weakness (generalized): Secondary | ICD-10-CM | POA: Diagnosis not present

## 2022-04-05 DIAGNOSIS — R2681 Unsteadiness on feet: Secondary | ICD-10-CM

## 2022-04-05 DIAGNOSIS — R2689 Other abnormalities of gait and mobility: Secondary | ICD-10-CM

## 2022-04-05 DIAGNOSIS — R600 Localized edema: Secondary | ICD-10-CM
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
## 2022-04-10 ENCOUNTER — Ambulatory Visit: Payer: 59

## 2022-04-10 DIAGNOSIS — M6281 Muscle weakness (generalized): Secondary | ICD-10-CM | POA: Diagnosis not present

## 2022-04-10 DIAGNOSIS — R600 Localized edema: Secondary | ICD-10-CM

## 2022-04-10 DIAGNOSIS — R2681 Unsteadiness on feet: Secondary | ICD-10-CM

## 2022-04-10 DIAGNOSIS — R2689 Other abnormalities of gait and mobility: Secondary | ICD-10-CM

## 2022-04-10 DIAGNOSIS — M25571 Pain in right ankle and joints of right foot: Secondary | ICD-10-CM

## 2022-04-12 ENCOUNTER — Other Ambulatory Visit: Payer: Self-pay | Admitting: Physical Medicine and Rehabilitation
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
## 2022-04-13 ENCOUNTER — Ambulatory Visit: Payer: 59

## 2022-04-13 DIAGNOSIS — R2681 Unsteadiness on feet: Secondary | ICD-10-CM

## 2022-04-13 DIAGNOSIS — R600 Localized edema: Secondary | ICD-10-CM

## 2022-04-13 DIAGNOSIS — M6281 Muscle weakness (generalized): Secondary | ICD-10-CM | POA: Diagnosis not present

## 2022-04-13 DIAGNOSIS — R2689 Other abnormalities of gait and mobility: Secondary | ICD-10-CM

## 2022-04-13 DIAGNOSIS — M25571 Pain in right ankle and joints of right foot: Secondary | ICD-10-CM

## 2022-04-17 ENCOUNTER — Ambulatory Visit: Payer: 59

## 2022-04-17 DIAGNOSIS — M25571 Pain in right ankle and joints of right foot: Secondary | ICD-10-CM

## 2022-04-17 DIAGNOSIS — R2689 Other abnormalities of gait and mobility: Secondary | ICD-10-CM

## 2022-04-17 DIAGNOSIS — M6281 Muscle weakness (generalized): Secondary | ICD-10-CM

## 2022-04-17 DIAGNOSIS — R600 Localized edema: Secondary | ICD-10-CM

## 2022-04-17 DIAGNOSIS — R2681 Unsteadiness on feet: Secondary | ICD-10-CM
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
## 2022-04-19 ENCOUNTER — Ambulatory Visit: Payer: 59 | Admitting: Physical Therapy

## 2022-04-19 HISTORY — PX: MULTIPLE TOOTH EXTRACTIONS: SHX2053

## 2022-04-23 ENCOUNTER — Inpatient Hospital Stay: Payer: 59 | Admitting: Physical Medicine and Rehabilitation

## 2022-04-24 ENCOUNTER — Ambulatory Visit: Payer: 59 | Admitting: Physical Therapy

## 2022-04-25 ENCOUNTER — Encounter: Payer: Self-pay | Admitting: Registered Nurse

## 2022-04-25 ENCOUNTER — Encounter: Payer: 59 | Attending: Physical Medicine and Rehabilitation | Admitting: Registered Nurse

## 2022-04-25 DIAGNOSIS — S0003XA Contusion of scalp, initial encounter: Secondary | ICD-10-CM | POA: Insufficient documentation

## 2022-04-25 DIAGNOSIS — K7689 Other specified diseases of liver: Secondary | ICD-10-CM | POA: Insufficient documentation

## 2022-04-25 DIAGNOSIS — I10 Essential (primary) hypertension: Secondary | ICD-10-CM | POA: Insufficient documentation

## 2022-04-25 DIAGNOSIS — Y939 Activity, unspecified: Secondary | ICD-10-CM | POA: Diagnosis not present

## 2022-04-25 DIAGNOSIS — S2243XA Multiple fractures of ribs, bilateral, initial encounter for closed fracture: Secondary | ICD-10-CM | POA: Diagnosis not present

## 2022-04-25 DIAGNOSIS — T1490XA Injury, unspecified, initial encounter: Secondary | ICD-10-CM | POA: Diagnosis not present
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
## 2022-04-26 ENCOUNTER — Encounter: Payer: Self-pay | Admitting: Physical Therapy

## 2022-04-26 ENCOUNTER — Ambulatory Visit: Payer: 59 | Attending: Student | Admitting: Physical Therapy

## 2022-04-26 DIAGNOSIS — M6281 Muscle weakness (generalized): Secondary | ICD-10-CM | POA: Diagnosis present

## 2022-04-26 DIAGNOSIS — R2689 Other abnormalities of gait and mobility: Secondary | ICD-10-CM | POA: Diagnosis present

## 2022-04-26 DIAGNOSIS — R600 Localized edema: Secondary | ICD-10-CM | POA: Diagnosis present

## 2022-04-26 DIAGNOSIS — R2681 Unsteadiness on feet: Secondary | ICD-10-CM | POA: Diagnosis present
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
## 2023-09-23 IMAGING — CR DG FOOT COMPLETE 3+V*R*
3 series · 3 of 3 positions shown · non-contrast
Comparison: 12/15/2021

CLINICAL DATA: Pain

EXAM:
RIGHT FOOT COMPLETE - 3+ VIEW

[foot ap]
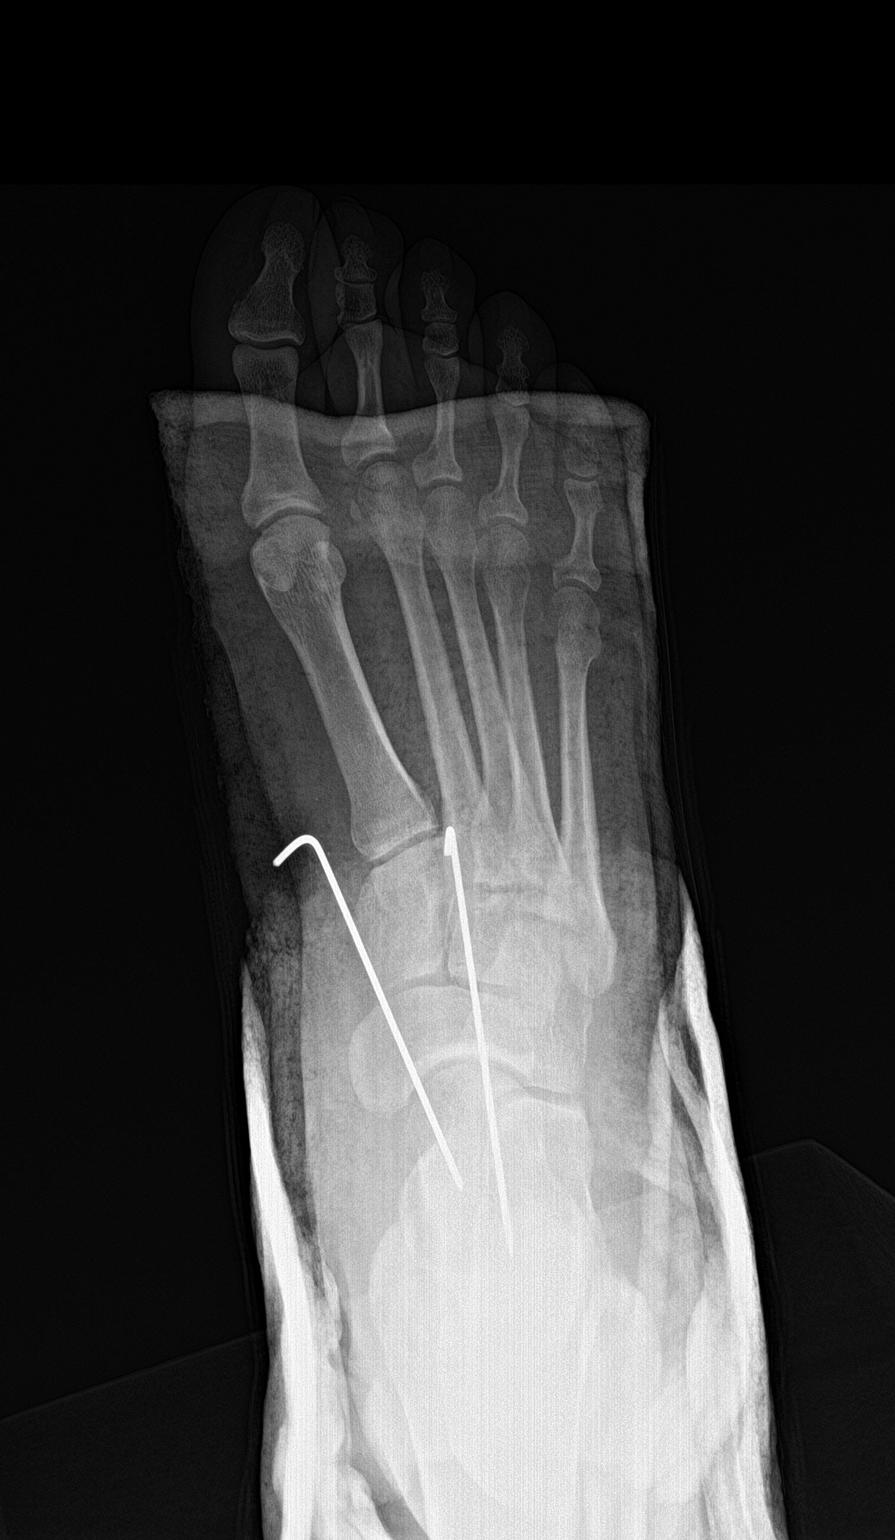

[foot obl]
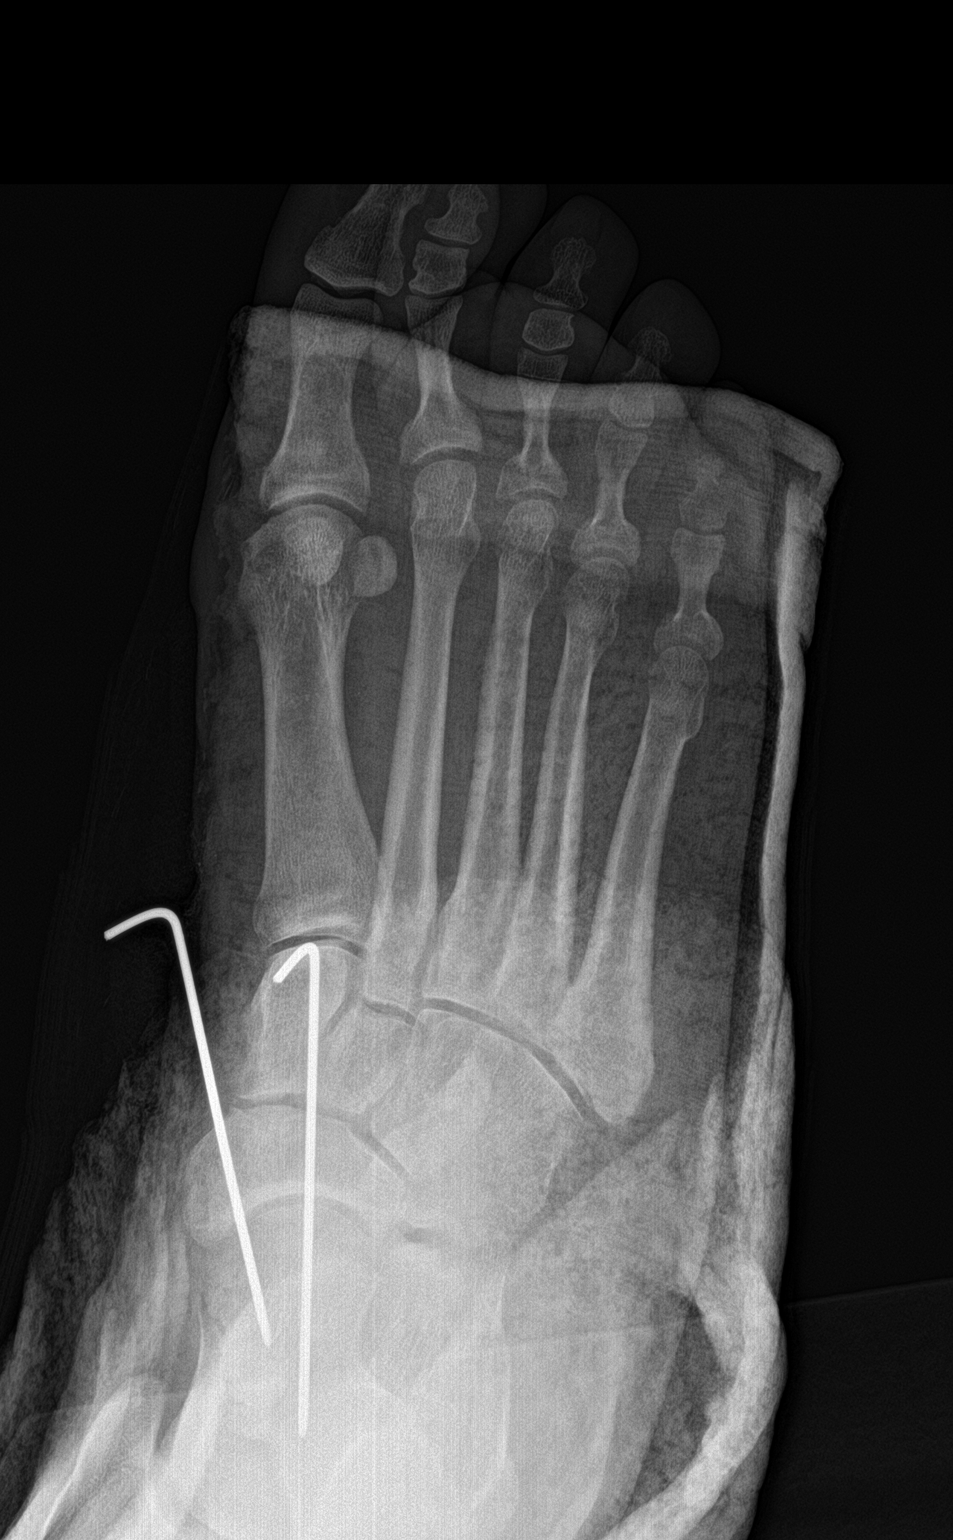

[foot lat]
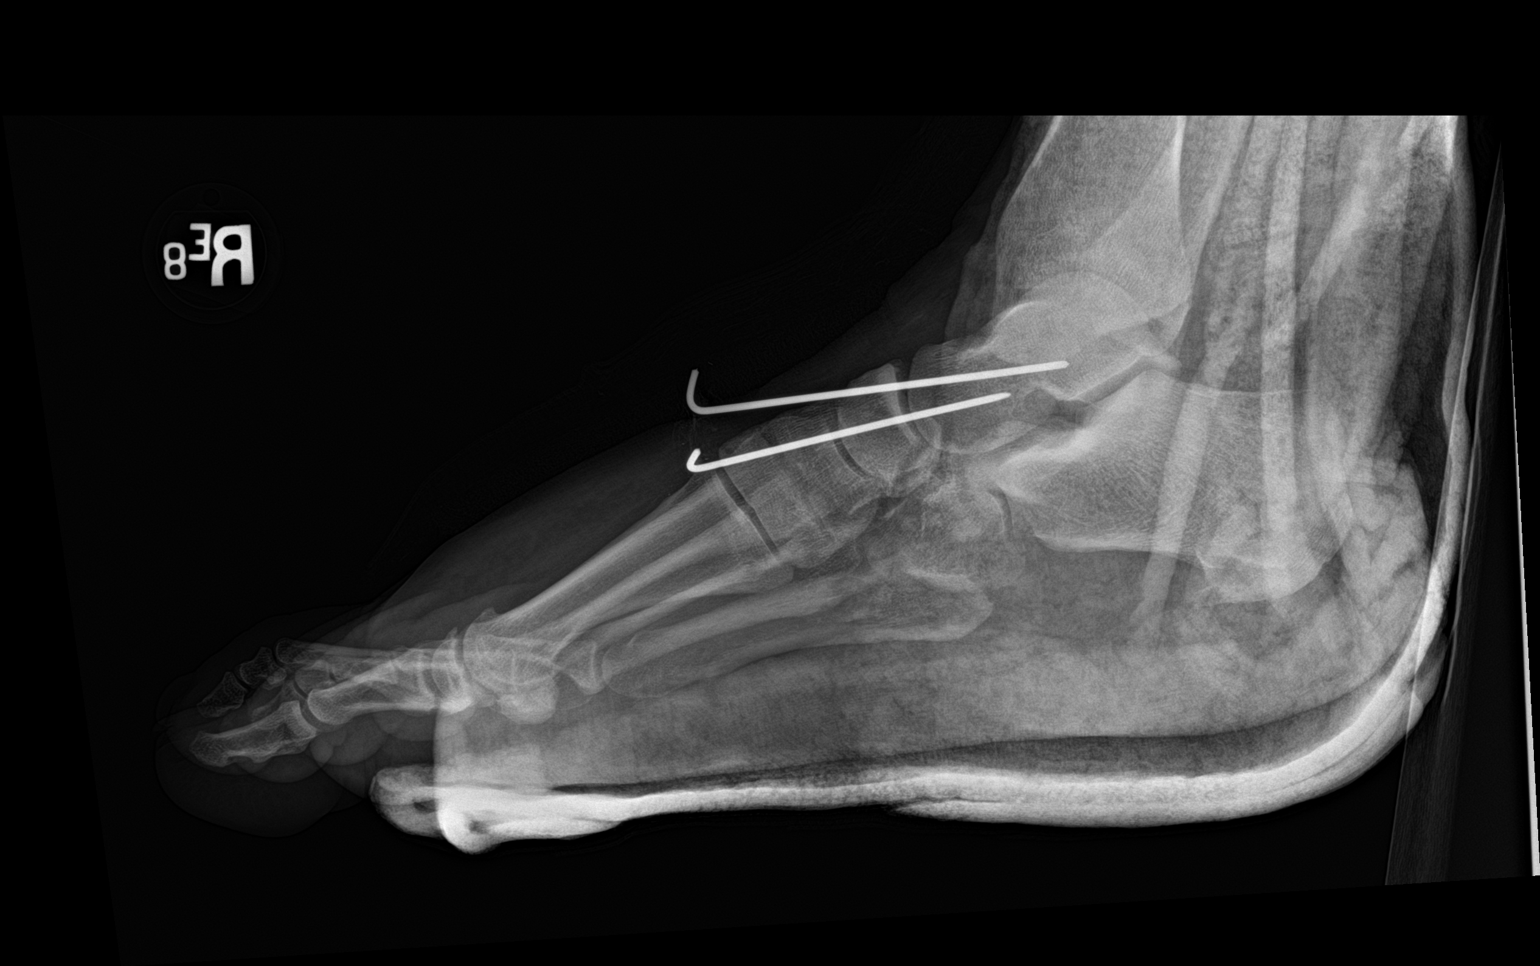

[3 of 3 positions shown; findings below may reference images not displayed]

FINDINGS: No significant interval changes are noted in the surgical pins
transfixing talonavicular joint. Overlying plaster splint limits
evaluation. As far as seen, no new fracture is noted. Plantar spur
is seen in the calcaneus. Bony spurs seen in first
metatarsophalangeal joint.
IMPRESSION: Previous internal fixation of right talonavicular joint. No
significant interval changes are noted.

## 2023-09-23 IMAGING — CR DG HUMERUS 2V *L*
2 series · 2 of 2 positions shown · non-contrast
Comparison: December 15, 2021.

CLINICAL DATA: Left humerus pain.

EXAM:
LEFT HUMERUS - 2+ VIEW

[humerus ap]
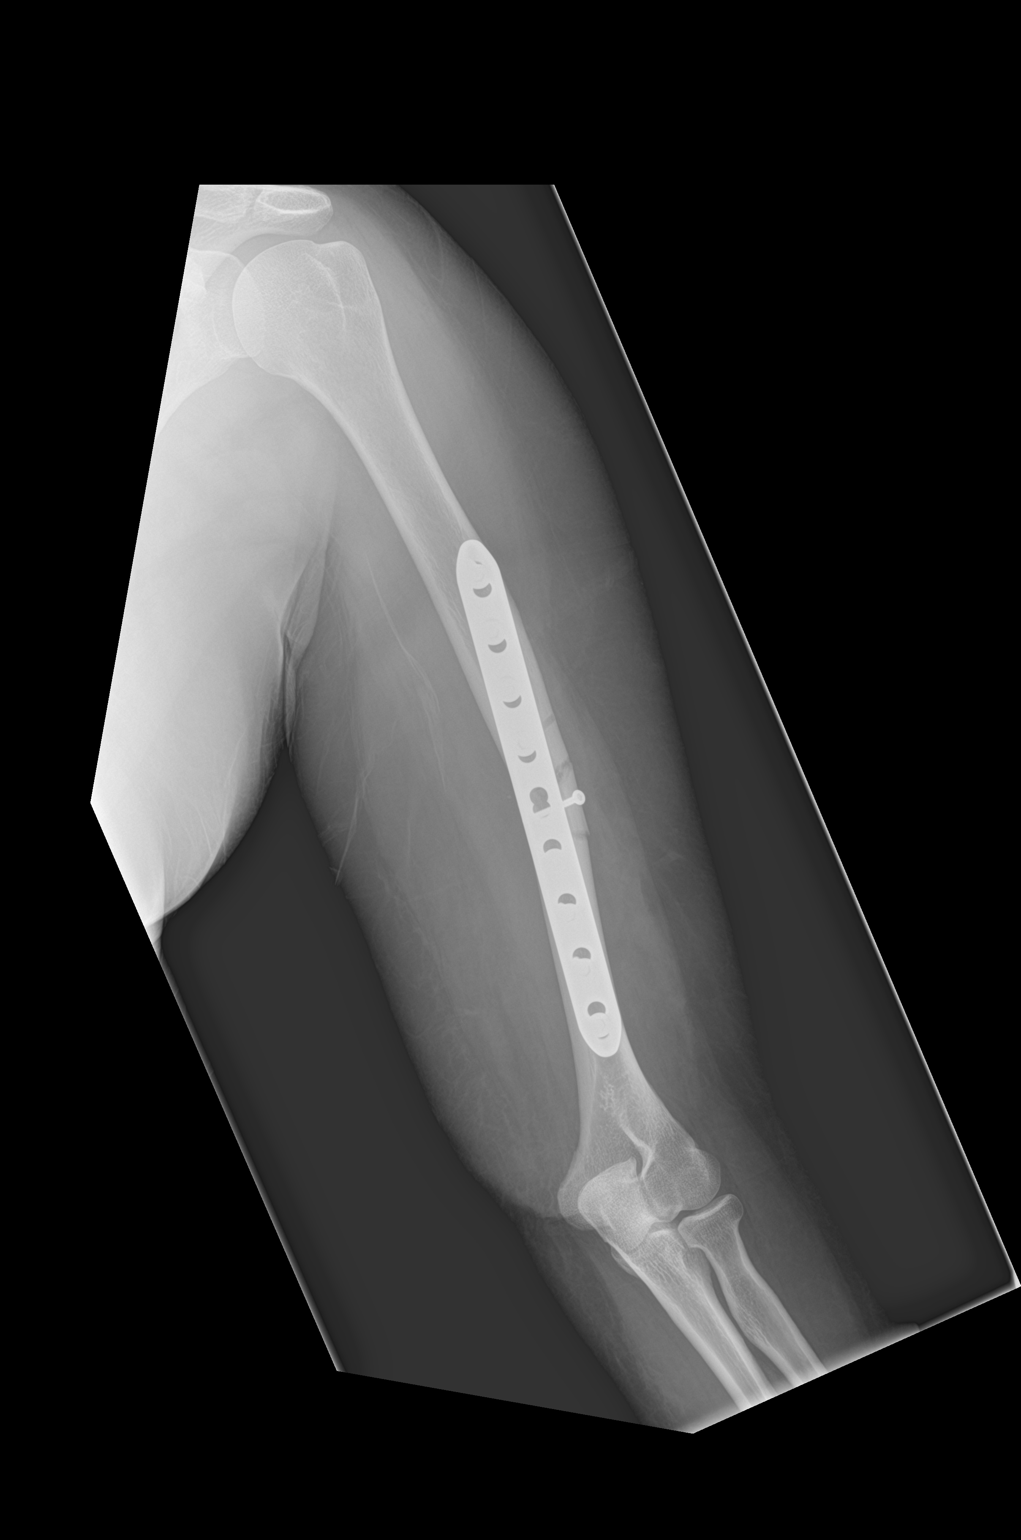

[humerus lat]
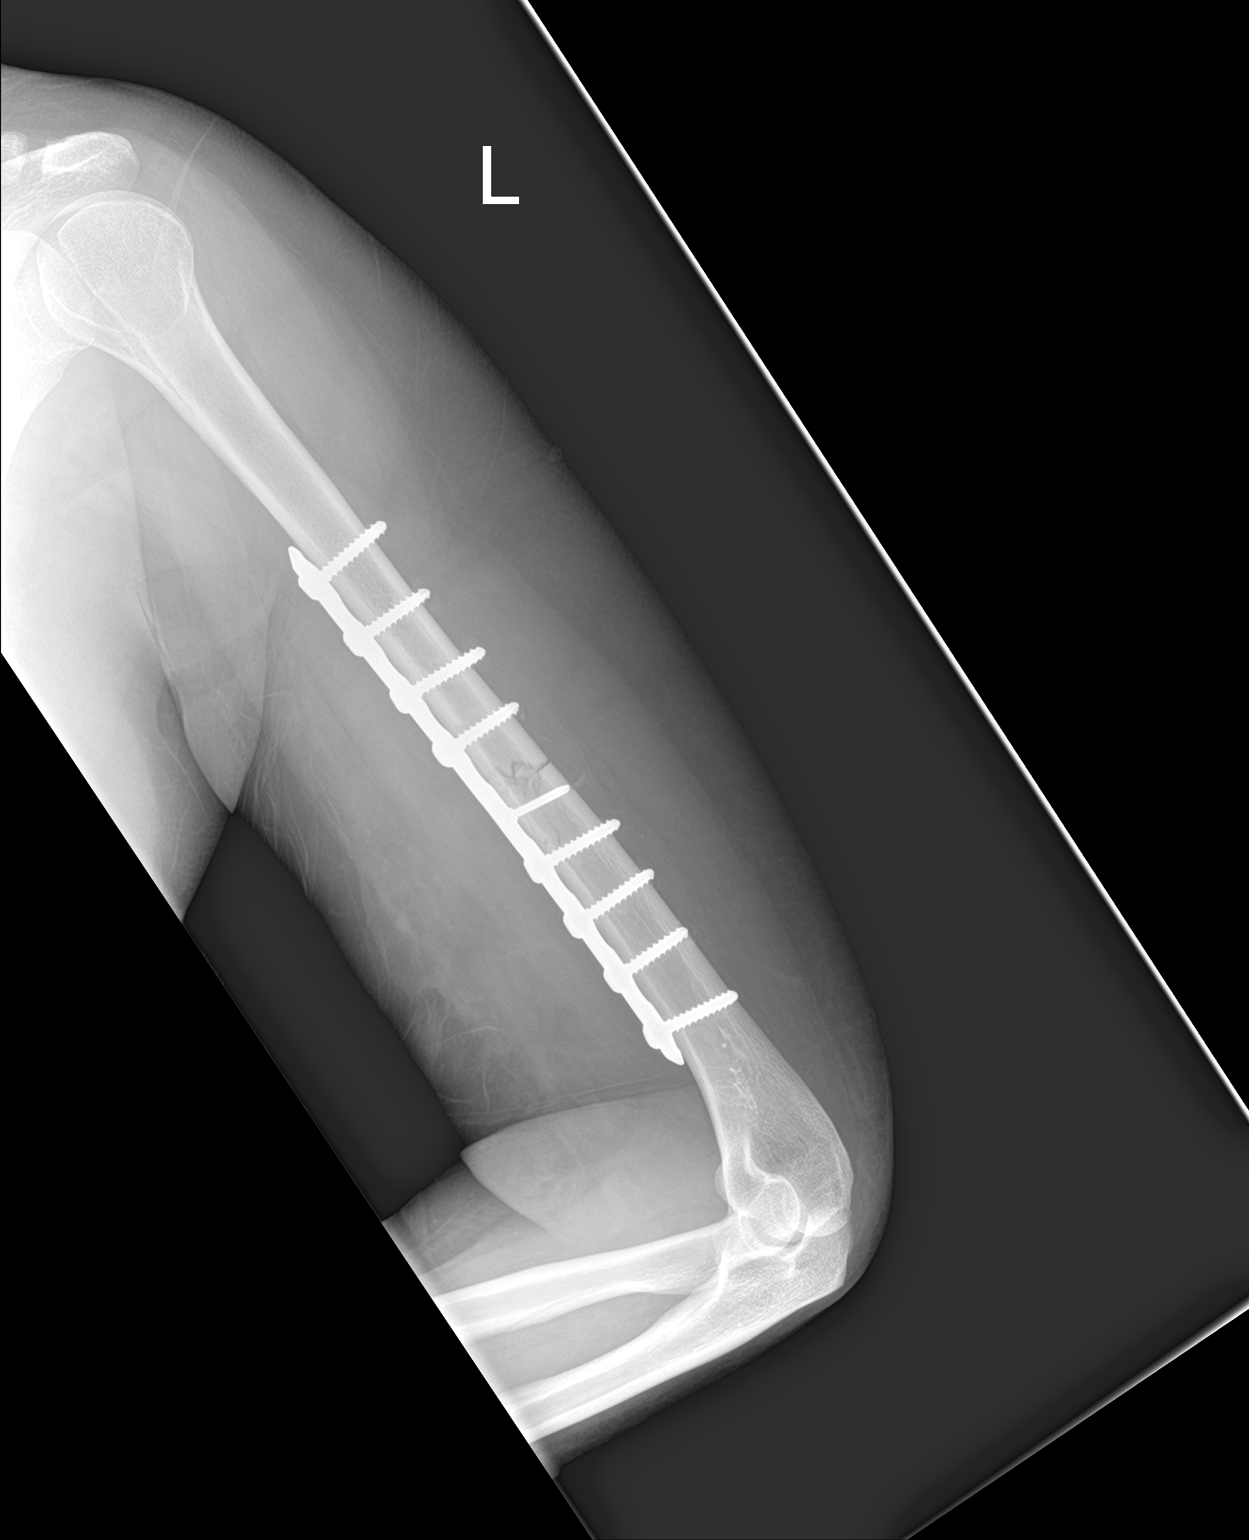

[2 of 2 positions shown; findings below may reference images not displayed]

FINDINGS: Status post surgical internal fixation of distal left humeral shaft
fracture. Good alignment of fracture components is noted. No
significant callus formation is seen at this time. No new fracture
is noted. No soft tissue abnormality is noted.
IMPRESSION: Status post surgical internal fixation of distal left humeral shaft
fracture.
